# Patient Record
Sex: Male | Born: 1985 | Race: White | Hispanic: No | Marital: Single | Smoking: Never smoker
Health system: Southern US, Community
[De-identification: ages and names within clinical notes are randomized; demographics above are authoritative.]

---

## 2017-06-20 ENCOUNTER — Inpatient Hospital Stay
Admission: EM | Admit: 2017-06-20 | Discharge: 2017-06-29 | DRG: 871 | Disposition: A | Discharge status: Home / Self Care | Payer: BLUE CROSS/BLUE SHIELD | Attending: Internal Medicine | Admitting: Internal Medicine

## 2017-06-20 ENCOUNTER — Emergency Department: Payer: BLUE CROSS/BLUE SHIELD

## 2017-06-20 ENCOUNTER — Encounter: Payer: Self-pay | Admitting: Emergency Medicine

## 2017-06-20 ENCOUNTER — Other Ambulatory Visit: Payer: Self-pay

## 2017-06-20 DIAGNOSIS — J849 Interstitial pulmonary disease, unspecified: Secondary | ICD-10-CM | POA: Diagnosis present

## 2017-06-20 DIAGNOSIS — J189 Pneumonia, unspecified organism: Secondary | ICD-10-CM

## 2017-06-20 DIAGNOSIS — R0689 Other abnormalities of breathing: Secondary | ICD-10-CM

## 2017-06-20 DIAGNOSIS — J9601 Acute respiratory failure with hypoxia: Secondary | ICD-10-CM | POA: Diagnosis present

## 2017-06-20 DIAGNOSIS — J181 Lobar pneumonia, unspecified organism: Secondary | ICD-10-CM | POA: Diagnosis present

## 2017-06-20 DIAGNOSIS — N179 Acute kidney failure, unspecified: Secondary | ICD-10-CM | POA: Diagnosis present

## 2017-06-20 DIAGNOSIS — A419 Sepsis, unspecified organism: Secondary | ICD-10-CM | POA: Diagnosis present

## 2017-06-20 DIAGNOSIS — R0902 Hypoxemia: Secondary | ICD-10-CM

## 2017-06-20 DIAGNOSIS — R030 Elevated blood-pressure reading, without diagnosis of hypertension: Secondary | ICD-10-CM | POA: Diagnosis present

## 2017-06-20 LAB — BASIC METABOLIC PANEL
ANION GAP: 13 (ref 5–15)
BUN: 13 mg/dL (ref 6–20)
CALCIUM: 8.7 mg/dL — AB (ref 8.9–10.3)
CO2: 26 mmol/L (ref 22–32)
CREATININE: 1.35 mg/dL — AB (ref 0.61–1.24)
Chloride: 96 mmol/L — ABNORMAL LOW (ref 101–111)
Glucose, Bld: 133 mg/dL — ABNORMAL HIGH (ref 65–99)
Potassium: 3.8 mmol/L (ref 3.5–5.1)
SODIUM: 135 mmol/L (ref 135–145)

## 2017-06-20 LAB — CBC WITH DIFFERENTIAL/PLATELET
BASOS ABS: 0 10*3/uL (ref 0–0.1)
BASOS PCT: 0 %
EOS ABS: 0.1 10*3/uL (ref 0–0.7)
Eosinophils Relative: 1 %
HCT: 38.8 % — ABNORMAL LOW (ref 40.0–52.0)
Hemoglobin: 13.4 g/dL (ref 13.0–18.0)
Lymphocytes Relative: 7 %
Lymphs Abs: 0.7 10*3/uL — ABNORMAL LOW (ref 1.0–3.6)
MCH: 29.8 pg (ref 26.0–34.0)
MCHC: 34.5 g/dL (ref 32.0–36.0)
MCV: 86.4 fL (ref 80.0–100.0)
MONO ABS: 0.7 10*3/uL (ref 0.2–1.0)
MONOS PCT: 7 %
Neutro Abs: 8.9 10*3/uL — ABNORMAL HIGH (ref 1.4–6.5)
Neutrophils Relative %: 85 %
PLATELETS: 344 10*3/uL (ref 150–440)
RBC: 4.49 MIL/uL (ref 4.40–5.90)
RDW: 13.1 % (ref 11.5–14.5)
WBC: 10.4 10*3/uL (ref 3.8–10.6)

## 2017-06-20 LAB — INFLUENZA PANEL BY PCR (TYPE A & B)
INFLBPCR: NEGATIVE
Influenza A By PCR: NEGATIVE

## 2017-06-20 LAB — MRSA PCR SCREENING: MRSA by PCR: NEGATIVE

## 2017-06-20 LAB — BRAIN NATRIURETIC PEPTIDE: B Natriuretic Peptide: 41 pg/mL (ref 0.0–100.0)

## 2017-06-20 LAB — EXPECTORATED SPUTUM ASSESSMENT W GRAM STAIN, RFLX TO RESP C: Special Requests: NORMAL

## 2017-06-20 LAB — EXPECTORATED SPUTUM ASSESSMENT W REFEX TO RESP CULTURE

## 2017-06-20 LAB — TROPONIN I

## 2017-06-20 LAB — LACTIC ACID, PLASMA
Lactic Acid, Venous: 1.7 mmol/L (ref 0.5–1.9)
Lactic Acid, Venous: 1.9 mmol/L (ref 0.5–1.9)

## 2017-06-20 MED ORDER — SODIUM CHLORIDE 0.9 % IV SOLN
INTRAVENOUS | Status: DC
  Administered 2017-06-20 – 2017-06-25 (×11): via INTRAVENOUS

## 2017-06-20 MED ORDER — SODIUM CHLORIDE 0.9 % IV BOLUS (SEPSIS)
1000.0000 mL | Freq: Once | INTRAVENOUS | Status: AC
  Administered 2017-06-20: 1000 mL via INTRAVENOUS

## 2017-06-20 MED ORDER — OSELTAMIVIR PHOSPHATE 75 MG PO CAPS
75.0000 mg | ORAL_CAPSULE | Freq: Once | ORAL | Status: AC
  Administered 2017-06-20: 75 mg via ORAL
  Filled 2017-06-20: qty 1

## 2017-06-20 MED ORDER — ACETAMINOPHEN 650 MG RE SUPP: 650.0000 mg | Freq: Four times a day (QID) | RECTAL | Status: DC | PRN

## 2017-06-20 MED ORDER — ORAL CARE MOUTH RINSE
15.0000 mL | Freq: Two times a day (BID) | OROMUCOSAL | Status: DC
  Administered 2017-06-20 – 2017-06-28 (×17): 15 mL via OROMUCOSAL

## 2017-06-20 MED ORDER — IPRATROPIUM-ALBUTEROL 0.5-2.5 (3) MG/3ML IN SOLN
3.0000 mL | Freq: Once | RESPIRATORY_TRACT | Status: AC
  Administered 2017-06-20: 3 mL via RESPIRATORY_TRACT
  Filled 2017-06-20: qty 3

## 2017-06-20 MED ORDER — VANCOMYCIN HCL IN DEXTROSE 1-5 GM/200ML-% IV SOLN
1000.0000 mg | Freq: Once | INTRAVENOUS | Status: AC
  Administered 2017-06-20: 1000 mg via INTRAVENOUS
  Filled 2017-06-20: qty 200

## 2017-06-20 MED ORDER — PIPERACILLIN-TAZOBACTAM 3.375 G IVPB 30 MIN
3.3750 g | Freq: Once | INTRAVENOUS | Status: AC
  Administered 2017-06-20: 3.375 g via INTRAVENOUS
  Filled 2017-06-20: qty 50

## 2017-06-20 MED ORDER — ONDANSETRON HCL 4 MG/2ML IJ SOLN: 4.0000 mg | Freq: Four times a day (QID) | INTRAMUSCULAR | Status: DC | PRN

## 2017-06-20 MED ORDER — ENOXAPARIN SODIUM 40 MG/0.4ML ~~LOC~~ SOLN
40.0000 mg | SUBCUTANEOUS | Status: DC
  Administered 2017-06-20 – 2017-06-28 (×9): 40 mg via SUBCUTANEOUS
  Filled 2017-06-20 (×9): qty 0.4

## 2017-06-20 MED ORDER — IPRATROPIUM-ALBUTEROL 0.5-2.5 (3) MG/3ML IN SOLN
3.0000 mL | Freq: Four times a day (QID) | RESPIRATORY_TRACT | Status: DC
  Administered 2017-06-20 – 2017-06-26 (×26): 3 mL via RESPIRATORY_TRACT
  Filled 2017-06-20 (×24): qty 3

## 2017-06-20 MED ORDER — ACETAMINOPHEN 325 MG PO TABS
650.0000 mg | ORAL_TABLET | Freq: Four times a day (QID) | ORAL | Status: DC | PRN
  Administered 2017-06-21: 18:00:00 650 mg via ORAL
  Filled 2017-06-20: qty 2

## 2017-06-20 MED ORDER — VANCOMYCIN HCL IN DEXTROSE 1-5 GM/200ML-% IV SOLN: 1000.0000 mg | Freq: Three times a day (TID) | INTRAVENOUS | Status: DC

## 2017-06-20 MED ORDER — PIPERACILLIN-TAZOBACTAM 3.375 G IVPB
3.3750 g | Freq: Three times a day (TID) | INTRAVENOUS | Status: DC
  Administered 2017-06-20 – 2017-06-25 (×14): 3.375 g via INTRAVENOUS
  Filled 2017-06-20 (×14): qty 50

## 2017-06-20 MED ORDER — ONDANSETRON HCL 4 MG PO TABS: 4.0000 mg | ORAL_TABLET | Freq: Four times a day (QID) | ORAL | Status: DC | PRN

## 2017-06-20 NOTE — ED Provider Notes (Signed)
[facility] Emergency Department Provider Note       Time seen: ----------------------------------------- 10:34 AM on 06/20/2017 -----------------------------------------   I have reviewed the triage vital signs and the nursing notes.  HISTORY   Chief Complaint No chief complaint on file.    HPI Edgar Michael is a 32 y.o. male with no significant past medical history who presents to the ED for dyspnea and persistent cough.  Patient was treated last week for influenza.  He took cough preparation as well as Tamiflu and has had worsening of symptoms.  He is having productive green sputum.  He has shortness of breath and was seen at an urgent care and referred here for evaluation due to hypoxia.  No past medical history on file.  There are no active problems to display for this patient.   Allergies Patient has no allergy information on record.  Social History Social History   Tobacco Use  . Smoking status: Not on file  Substance Use Topics  . Alcohol use: Not on file  . Drug use: Not on file   Review of Systems Constitutional: Negative for fever. Cardiovascular: Negative for chest pain. Respiratory: Positive for shortness of breath with productive cough Gastrointestinal: Negative for abdominal pain, vomiting and diarrhea. Musculoskeletal: Negative for back pain. Skin: Negative for rash. Neurological: Negative for headaches, focal weakness or numbness.  All systems negative/normal/unremarkable except as stated in the HPI  ____________________________________________   PHYSICAL EXAM:  VITAL SIGNS: ED Triage Vitals  Enc Vitals Group     BP      Pulse      Resp      Temp      Temp src      SpO2      Weight      Height      Head Circumference      Peak Flow      Pain Score      Pain Loc      Pain Edu?      Excl. in GC?     Constitutional: Alert and oriented.  Anxious, mild distress Eyes: Conjunctivae are normal. Normal  extraocular movements. ENT   Head: Normocephalic and atraumatic.   Nose: No congestion/rhinnorhea.   Mouth/Throat: Mucous membranes are moist.   Neck: No stridor. Cardiovascular: Normal rate, regular rhythm. No murmurs, rubs, or gallops. Respiratory: Mild tachypnea, rales particular in the right base Gastrointestinal: Soft and nontender. Normal bowel sounds Musculoskeletal: Nontender with normal range of motion in extremities. No lower extremity tenderness nor edema. Neurologic:  Normal speech and language. No gross focal neurologic deficits are appreciated.  Skin:  Skin is warm, dry and intact.  Mild diaphoresis is noted Psychiatric: Mood and affect are normal. Speech and behavior are normal.  ____________________________________________  ED COURSE:  As part of my medical decision making, I reviewed the following data within the electronic MEDICAL RECORD NUMBER History obtained from family if available, nursing notes, old chart and ekg, as well as notes from prior ED visits. Patient presented for hypoxia, we will assess with labs and imaging as indicated at this time.   Procedures ____________________________________________   LABS (pertinent positives/negatives)  Labs Reviewed  CBC WITH DIFFERENTIAL/PLATELET - Abnormal; Notable for the following components:      Result Value   HCT 38.8 (*)    Neutro Abs 8.9 (*)    Lymphs Abs 0.7 (*)    All other components within normal limits  BASIC METABOLIC PANEL - Abnormal; Notable for  the following components:   Chloride 96 (*)    Glucose, Bld 133 (*)    Creatinine, Ser 1.35 (*)    Calcium 8.7 (*)    All other components within normal limits  CULTURE, BLOOD (ROUTINE X 2)  CULTURE, BLOOD (ROUTINE X 2)  CULTURE, EXPECTORATED SPUTUM-ASSESSMENT  BRAIN NATRIURETIC PEPTIDE  TROPONIN I  LACTIC ACID, PLASMA  LACTIC ACID, PLASMA  INFLUENZA PANEL BY PCR (TYPE A & B)   CRITICAL CARE Performed by: Ulice DashJohnathan E Williams   Total  critical care time: 30 minutes  Critical care time was exclusive of separately billable procedures and treating other patients.  Critical care was necessary to treat or prevent imminent or life-threatening deterioration.  Critical care was time spent personally by me on the following activities: development of treatment plan with patient and/or surrogate as well as nursing, discussions with consultants, evaluation of patient's response to treatment, examination of patient, obtaining history from patient or surrogate, ordering and performing treatments and interventions, ordering and review of laboratory studies, ordering and review of radiographic studies, pulse oximetry and re-evaluation of patient's condition.  RADIOLOGY Images were viewed by me  CXR  IMPRESSION: Right middle lobe consolidation with patchy lower lobe airspace opacities bilaterally consistent with multilobar pneumonia.  These results will be called to the ordering clinician or representative by the Radiologist Assistant, and communication documented in the PACS or zVision Dashboard. ____________________________________________  DIFFERENTIAL DIAGNOSIS   Pneumonia, influenza, pneumothorax, PE, bronchitis, URI  FINAL ASSESSMENT AND PLAN  Pneumonia, hypoxia   Plan: Patient had presented for shortness of breath and possible recent treatment for influenza. Patient's labs were remarkably reassuring despite his significant hypoxia. Patient's imaging revealed a right middle lobe and bilateral lower lobe airspace opacities consistent with multi lobar pneumonia.  We have started on broad-spectrum antibiotics including vancomycin and Zosyn.  I also began treating with Tamiflu.  He remains hypoxic and desaturates quickly off of oxygen.  I will discussed with the hospitalist for admission.   Ulice DashJohnathan E Williams, MD   Note: This note was generated in part or whole with voice recognition software. Voice recognition is  usually quite accurate but there are transcription errors that can and very often do occur. I apologize for any typographical errors that were not detected and corrected.     Emily FilbertWilliams, Jonathan E, MD 06/20/17 1149

## 2017-06-20 NOTE — ED Triage Notes (Signed)
Pt to ED via EMS from urgent care for sob, pt was recently diagnosed with flu last week, c/o increased chills and sob. Pt is diaphoretic and SOB, 85% on RA

## 2017-06-20 NOTE — Plan of Care (Signed)
  Progressing Education: Knowledge of General Education information will improve 06/20/2017 2316 - Progressing by Carolin SicksBollinger, Zailey Audia R, RN Health Behavior/Discharge Planning: Ability to manage health-related needs will improve 06/20/2017 2317 - Progressing by Carolin SicksBollinger, Dietz Mulvey R, RN 06/20/2017 2316 - Progressing by Carolin SicksBollinger, Dashiel Bergquist R, RN Clinical Measurements: Ability to maintain clinical measurements within normal limits will improve 06/20/2017 2316 - Progressing by Carolin SicksBollinger, Coe Angelos R, RN Will remain free from infection 06/20/2017 2316 - Progressing by Carolin SicksBollinger, Navya Timmons R, RN Diagnostic test results will improve 06/20/2017 2316 - Progressing by Carolin SicksBollinger, Shayma Pfefferle R, RN Respiratory complications will improve 06/20/2017 2316 - Progressing by Carolin SicksBollinger, Geneva Pallas R, RN Cardiovascular complication will be avoided 06/20/2017 2316 - Progressing by Carolin SicksBollinger, Naol Ontiveros R, RN Activity: Risk for activity intolerance will decrease 06/20/2017 2316 - Progressing by Carolin SicksBollinger, Jasha Hodzic R, RN Nutrition: Adequate nutrition will be maintained 06/20/2017 2317 - Progressing by Carolin SicksBollinger, Aarush Stukey R, RN 06/20/2017 2316 - Progressing by Carolin SicksBollinger, Ravonda Brecheen R, RN Coping: Level of anxiety will decrease 06/20/2017 2316 - Progressing by Carolin SicksBollinger, Elvira Langston R, RN Elimination: Will not experience complications related to bowel motility 06/20/2017 2317 - Progressing by Carolin SicksBollinger, Derreon Consalvo R, RN 06/20/2017 2316 - Progressing by Carolin SicksBollinger, Feven Alderfer R, RN Will not experience complications related to urinary retention 06/20/2017 2317 - Progressing by Carolin SicksBollinger, Jacquelinne Speak R, RN 06/20/2017 2316 - Progressing by Carolin SicksBollinger, Yancey Pedley R, RN Pain Managment: General experience of comfort will improve 06/20/2017 2316 - Progressing by Carolin SicksBollinger, Vertis Scheib R, RN Safety: Ability to remain free from injury will improve 06/20/2017 2316 - Progressing by Carolin SicksBollinger, Marveen Donlon R, RN Skin Integrity: Risk for impaired skin integrity will decrease 06/20/2017 2317 - Progressing by  Carolin SicksBollinger, Bharat Antillon R, RN 06/20/2017 2316 - Progressing by Carolin SicksBollinger, Pearla Mckinny R, RN

## 2017-06-20 NOTE — ED Notes (Signed)
Report to Chris.

## 2017-06-20 NOTE — Progress Notes (Signed)
SLP Cancellation Note  Patient Details Name: Edgar Michael MRN: [id number] DOB: Oct 31, 1985   Cancelled treatment:       Reason Eval/Treat Not Completed: SLP screened, no needs identified, will sign off(chart reviewed; consulted pt and Mother in room). Pt denied any difficulty swallowing and is currently on a regular diet; tolerates swallowing pills w/ water per NSG in the ED per his report. Pt denied any difficulty swallowing at home w/ meals/liquids. He did state he had been sick (flu?) w/ drainage and does sleep on his R side in bed at night. Pt conversive and A/Ox3.  Pt and Mother denied any need for a swallowing eval at this time. No further skilled ST services indicated as pt appears at his baseline. Recommend a Regular diet consistency w/ general aspiration precautions. Pt agreed. NSG to reconsult if any change in status.    Jerilynn SomKatherine Timo Hartwig, MS, CCC-SLP Edgar Michael 06/20/2017, 2:09 PM

## 2017-06-20 NOTE — H&P (Signed)
[facility] -  at [facility]   PATIENT NAME: Edgar Michael    MR#:  [id number]  DATE OF BIRTH:  1986-04-02  DATE OF ADMISSION:  06/20/2017  PRIMARY CARE PHYSICIAN: Patient, No Pcp Per   REQUESTING/REFERRING PHYSICIAN: dr Mayford Knifewilliams  CHIEF COMPLAINT:   SOB HISTORY OF PRESENT ILLNESS:  Edgar Michael  is a 32 y.o. male with no medical history who presents today to the emergency room complaining shortness of breath and cough. Patient came via EMS from urgent care. He was diaphoretic and shortness of breath. His O2 sat was 85% on room air. He is currently on 6 L oxygen. He was seen at urgent care this when his day and tested negative for influenza however did receive Tamiflu. He had influenza testing again today and it was negative. Patient had a chest x-ray which showed dense right middle lobe consolidation. He has been started on Zosyn by ED physician. Patient reports weakness, low-grade fever, body aches and chills. Patient reports no HIV risk factors.  PAST MEDICAL HISTORY:  No past medical history  PAST SURGICAL HISTORY:  None  SOCIAL HISTORY:   Social History   Tobacco Use  . Smoking status: Never Smoker  . Smokeless tobacco: Never Used  Substance Use Topics  . Alcohol use: No    Frequency: Never    FAMILY HISTORY:  No history of CAD  DRUG ALLERGIES:  No Known Allergies  REVIEW OF SYSTEMS:   Review of Systems  Constitutional: Positive for chills, fever and malaise/fatigue.  HENT: Negative.  Negative for ear discharge, ear pain, hearing loss, nosebleeds and sore throat.   Eyes: Negative.  Negative for blurred vision and pain.  Respiratory: Positive for cough, shortness of breath and wheezing. Negative for hemoptysis.   Cardiovascular: Negative.  Negative for chest pain, palpitations and leg swelling.  Gastrointestinal: Negative.  Negative for abdominal pain, blood in stool, diarrhea, nausea and vomiting.  Genitourinary: Negative.  Negative for  dysuria.  Musculoskeletal: Negative.  Negative for back pain.  Skin: Negative.   Neurological: Positive for weakness. Negative for dizziness, tremors, speech change, focal weakness, seizures and headaches.  Endo/Heme/Allergies: Negative.  Does not bruise/bleed easily.  Psychiatric/Behavioral: Negative.  Negative for depression, hallucinations and suicidal ideas.    MEDICATIONS AT HOME:   None   VITAL SIGNS:  Blood pressure (!) 141/73, pulse (!) 111, temperature 98.7 F (37.1 C), temperature source Oral, resp. rate (!) 22, height 6\' 1"  (1.854 m), weight 104.3 kg (230 lb), SpO2 94 %.  PHYSICAL EXAMINATION:   Physical Exam  Constitutional: He is oriented to person, place, and time and well-developed, well-nourished, and in no distress. No distress.  HENT:  Head: Normocephalic.  Eyes: No scleral icterus.  Neck: Normal range of motion. Neck supple. No JVD present. No tracheal deviation present.  Cardiovascular: Normal rate, regular rhythm and normal heart sounds. Exam reveals no gallop and no friction rub.  No murmur heard. Pulmonary/Chest: Effort normal and breath sounds normal. No respiratory distress. He has no wheezes. He has no rales. He exhibits no tenderness.  Bilateral rhonchi right greater than left  Abdominal: Soft. Bowel sounds are normal. He exhibits no distension and no mass. There is no tenderness. There is no rebound and no guarding.  Musculoskeletal: Normal range of motion. He exhibits no edema.  Neurological: He is alert and oriented to person, place, and time.  Skin: Skin is warm. No rash noted. No erythema.  Psychiatric: Affect and judgment normal.  LABORATORY PANEL:   CBC Recent Labs  Lab 06/20/17 1036  WBC 10.4  HGB 13.4  HCT 38.8*  PLT 344   ------------------------------------------------------------------------------------------------------------------  Chemistries  Recent Labs  Lab 06/20/17 1036  NA 135  K 3.8  CL 96*  CO2 26  GLUCOSE  133*  BUN 13  CREATININE 1.35*  CALCIUM 8.7*   ------------------------------------------------------------------------------------------------------------------  Cardiac Enzymes Recent Labs  Lab 06/20/17 1036  TROPONINI <0.03   ------------------------------------------------------------------------------------------------------------------  RADIOLOGY:  Dg Chest 2 View  Result Date: 06/20/2017 CLINICAL DATA:  Flu like symptoms for 1 week. Weakness with fever and bradycardia. EXAM: CHEST  2 VIEW COMPARISON:  None. FINDINGS: The heart size and mediastinal contours are normal. There is dense right middle lobe airspace disease with mild volume loss, consistent with pneumonia. Patchy airspace opacities are present in both lower lobes. There is no significant pleural effusion. The bones appear unremarkable. IMPRESSION: Right middle lobe consolidation with patchy lower lobe airspace opacities bilaterally consistent with multilobar pneumonia. These results will be called to the ordering clinician or representative by the Radiologist Assistant, and communication documented in the PACS or zVision Dashboard. Electronically Signed   By: Carey Bullocks M.D.   On: 06/20/2017 11:36    EKG:  No orders found for this or any previous visit.  IMPRESSION AND PLAN:   32 year old male with no past medical history presents with cough and shortness of breath.  1 Sepsis: Patient presents with tachycardia and tachypnea. Sepsis is due to community-acquired pneumonia  2. Acute hypoxic respiratory failure in the setting of community-acquired pneumonia Continue Zosyn Speech consultation for right middle lobe consolidation Pulmonary consultation Check HIV Follow up on blood cultures  3. Community-acquired pneumonia: Continue plan as stated above  4. Acute kidney injury in the setting of sepsis Start IV fluids and repeat BMP in a.m.  5. Elevated blood pressure without diagnosis of hypertension Follow  blood pressure Likely due to respiratory distress   All the records are reviewed and case discussed with ED provider. Management plans discussed with the patient and family and he is in agreement  CODE STATUS: FULL  TOTAL TIME TAKING CARE OF THIS PATIENT: 45 minutes.    Mackenna Kamer M.D on 06/20/2017 at 12:29 PM  Between 7am to 6pm - Pager - [phone]  After 6pm go to [email or web address] - password [email or web address]  [facility]  Office  [phone]  CC: Primary care physician; Patient, No Pcp Per

## 2017-06-20 NOTE — Progress Notes (Addendum)
ANTIBIOTIC CONSULT NOTE - INITIAL  Pharmacy Consult for Zosyn and vancomycin Indication: CAP/PNA  No Known Allergies  Patient Measurements: Height: 6\' 1"  (185.4 cm) Weight: 230 lb (104.3 kg) IBW/kg (Calculated) : 79.9 Adjusted Body Weight:   Vital Signs: Temp: 98.7 F (37.1 C) (02/18 1040) Temp Source: Oral (02/18 1040) BP: 141/73 (02/18 1142) Pulse Rate: 111 (02/18 1142) Intake/Output from previous day: No intake/output data recorded. Intake/Output from this shift: No intake/output data recorded.  Labs: Recent Labs    06/20/17 1036  WBC 10.4  HGB 13.4  PLT 344  CREATININE 1.35*   Estimated Creatinine Clearance: 102.4 mL/min (A) (by C-G formula based on SCr of 1.35 mg/dL (H)). No results for input(s): VANCOTROUGH, VANCOPEAK, VANCORANDOM, GENTTROUGH, GENTPEAK, GENTRANDOM, TOBRATROUGH, TOBRAPEAK, TOBRARND, AMIKACINPEAK, AMIKACINTROU, AMIKACIN in the last 72 hours.   Microbiology: No results found for this or any previous visit (from the past 720 hour(s)).  Medical History: History reviewed. No pertinent past medical history.  Medications:  Infusions:  . sodium chloride    . piperacillin-tazobactam 3.375 g (06/20/17 1151)  . piperacillin-tazobactam (ZOSYN)  IV    . sodium chloride 1,000 mL (06/20/17 1151)  . sodium chloride 1,000 mL (06/20/17 1150)  . vancomycin 1,000 mg (06/20/17 1151)  . vancomycin     Assessment: 31 yom cc dyspnea/persistent cough. Treated last week for influenza with Tamiflu and supportive. Returned to urgent care d/t persistent SOB and referred to ED. In ED CXR with RML consolidation/patchy lower lobe airspace opacities b/l consistent with multilobar PNA. MRSA PCR negative. Pharmacy consulted to dose vancomycin and Zosyn for PNA.  Goal of Therapy:  Vancomycin trough level 15-20 mcg/ml  Plan:  1. Zosyn 3.375 gm IV Q8H EI 2. Vancomycin 1 gm IV x 1 in ED followed in approximately 6 hours (Stacked dosing) by vancomycin 1 gm IV Q8H, predicted  trough 16 mcg/mL. Pharmacy will continue to follow and adjust as needed to maintain trough 15 to 20 mcg/mL.   Vd 62.8 L, Ke 0.089 hr-1, T1/2 7.8 hr  06/20/17 12:19 - MRSA PCR negative - Dr. Luberta MutterKonidena gives verbal to d/c vancomycin.   Carola FrostNathan A Kristian Hazzard, Pharm.D., BCPS Clinical Pharmacist 06/20/2017,12:13 PM

## 2017-06-21 LAB — BASIC METABOLIC PANEL
ANION GAP: 14 (ref 5–15)
BUN: 12 mg/dL (ref 6–20)
CHLORIDE: 106 mmol/L (ref 101–111)
CO2: 21 mmol/L — AB (ref 22–32)
Calcium: 8.7 mg/dL — ABNORMAL LOW (ref 8.9–10.3)
Creatinine, Ser: 1.08 mg/dL (ref 0.61–1.24)
GFR calc non Af Amer: >60 mL/min (ref >60)
GLUCOSE: 121 mg/dL — AB (ref 65–99)
Potassium: 4.3 mmol/L (ref 3.5–5.1)
Sodium: 141 mmol/L (ref 135–145)

## 2017-06-21 LAB — CBC
HCT: 35.8 % — ABNORMAL LOW (ref 40.0–52.0)
HEMOGLOBIN: 12.3 g/dL — AB (ref 13.0–18.0)
MCH: 29.8 pg (ref 26.0–34.0)
MCHC: 34.3 g/dL (ref 32.0–36.0)
MCV: 86.8 fL (ref 80.0–100.0)
Platelets: 352 10*3/uL (ref 150–440)
RBC: 4.12 MIL/uL — AB (ref 4.40–5.90)
RDW: 12.5 % (ref 11.5–14.5)
WBC: 9.4 10*3/uL (ref 3.8–10.6)

## 2017-06-21 LAB — GLUCOSE, CAPILLARY: Glucose-Capillary: 103 mg/dL — ABNORMAL HIGH (ref 65–99)

## 2017-06-21 LAB — HIV ANTIBODY (ROUTINE TESTING W REFLEX): HIV Screen 4th Generation wRfx: NONREACTIVE

## 2017-06-21 NOTE — Progress Notes (Signed)
[facility] - [place] at [facility]   PATIENT NAME: Edgar Michael    MR#:  [id number]  DATE OF BIRTH:  [birth year]  SUBJECTIVE:   Patient was on nonrebreather last night. He felt that he slept better with a nonrebreather. Continues to have shortness of breath and cough  REVIEW OF SYSTEMS:    Review of Systems  Constitutional: Negative for fever, chills weight loss HENT: Negative for ear pain, nosebleeds, congestion, facial swelling, rhinorrhea, neck pain, neck stiffness and ear discharge.   Respiratory: Positive for cough and shortness of breath  Cardiovascular: Negative for chest pain, palpitations and leg swelling.  Gastrointestinal: Negative for heartburn, abdominal pain, vomiting, diarrhea or consitpation Genitourinary: Negative for dysuria, urgency, frequency, hematuria Musculoskeletal: Negative for back pain or joint pain Neurological: Negative for dizziness, seizures, syncope, focal weakness,  numbness and headaches.  Hematological: Does not bruise/bleed easily.  Psychiatric/Behavioral: Negative for hallucinations, confusion, dysphoric mood    Tolerating Diet: yes      DRUG ALLERGIES:  No Known Allergies  VITALS:  Blood pressure 140/77, pulse 92, temperature 99.8 F (37.7 C), temperature source Oral, resp. rate 17, height 6\' 1"  (1.854 m), weight 90.4 kg (199 lb 6.4 oz), SpO2 92 %.  PHYSICAL EXAMINATION:  Constitutional: Appears well-developed and well-nourished. No distress. HENT: Normocephalic. Marland Kitchen Oropharynx is clear and moist.  Eyes: Conjunctivae and EOM are normal. PERRLA, no scleral icterus.  Neck: Normal ROM. Neck supple. No JVD. No tracheal deviation. CVS: RRR, S1/S2 +, no murmurs, no gallops, no carotid bruit.  Pulmonary: Normal respiratory effort with expiratory crackles right middle lobe Abdominal: Soft. BS +,  no distension, tenderness, rebound or guarding.  Musculoskeletal: Normal range of motion. No edema and no tenderness.  Neuro:  Alert. CN 2-12 grossly intact. No focal deficits. Skin: Skin is warm and dry. No rash noted. Psychiatric: Normal mood and affect.      LABORATORY PANEL:   CBC Recent Labs  Lab 06/21/17 0459  WBC 9.4  HGB 12.3*  HCT 35.8*  PLT 352   ------------------------------------------------------------------------------------------------------------------  Chemistries  Recent Labs  Lab 06/21/17 0459  NA 141  K 4.3  CL 106  CO2 21*  GLUCOSE 121*  BUN 12  CREATININE 1.08  CALCIUM 8.7*   ------------------------------------------------------------------------------------------------------------------  Cardiac Enzymes Recent Labs  Lab 06/20/17 1036  TROPONINI <0.03   ------------------------------------------------------------------------------------------------------------------  RADIOLOGY:  Dg Chest 2 View  Result Date: 06/20/2017 CLINICAL DATA:  Flu like symptoms for 1 week. Weakness with fever and bradycardia. EXAM: CHEST  2 VIEW COMPARISON:  None. FINDINGS: The heart size and mediastinal contours are normal. There is dense right middle lobe airspace disease with mild volume loss, consistent with pneumonia. Patchy airspace opacities are present in both lower lobes. There is no significant pleural effusion. The bones appear unremarkable. IMPRESSION: Right middle lobe consolidation with patchy lower lobe airspace opacities bilaterally consistent with multilobar pneumonia. These results will be called to the ordering clinician or representative by the Radiologist Assistant, and communication documented in the PACS or zVision Dashboard. Electronically Signed   By: Carey Bullocks M.D.   On: 06/20/2017 11:36     ASSESSMENT AND PLAN:   32 year old male with no past medical history presents with cough and shortness of breath.  1 Sepsis: Patient presents with tachycardia and tachypnea. Sepsis is due to community-acquired pneumonia  2. Acute hypoxic respiratory failure in the  setting of community-acquired pneumonia Continue Zosyn Blood cultures are negative. Sputum culture shows few gram-positive cocci and peers and few  gram-positive rods wean to room air is tolerated.   3. Community-acquired pneumonia: Continue plan as stated above  4. Acute kidney injury in the setting of sepsis Creatinine has improved with IV fluids  5. Elevated blood pressure without diagnosis of hypertension Follow blood pressure Likely due to respiratory distress        Management plans discussed with the patient and he is in agreement.  CODE STATUS: Full  TOTAL TIME TAKING CARE OF THIS PATIENT: 30 minutes.     POSSIBLE D/C 2 days, DEPENDING ON CLINICAL CONDITION.   Edgar Michael M.D on 06/21/2017 at 11:05 AM  Between 7am to 6pm - Pager - [phone] After 6pm go to [email or web address] - password [email or web address]  [facility]  Office  [phone]  CC: Primary care physician; Patient, No Pcp Per  Note: This dictation was prepared with Dragon dictation along with smaller phrase technology. Any transcriptional errors that result from this process are unintentional.

## 2017-06-22 LAB — GLUCOSE, CAPILLARY: GLUCOSE-CAPILLARY: 93 mg/dL (ref 65–99)

## 2017-06-22 MED ORDER — GUAIFENESIN-DM 100-10 MG/5ML PO SYRP
5.0000 mL | ORAL_SOLUTION | ORAL | Status: DC | PRN
  Administered 2017-06-23 – 2017-06-28 (×4): 5 mL via ORAL
  Filled 2017-06-22 (×5): qty 5

## 2017-06-22 MED ORDER — SODIUM CHLORIDE 0.9 % IV SOLN
500.0000 mg | INTRAVENOUS | Status: DC
  Administered 2017-06-22 – 2017-06-24 (×3): 500 mg via INTRAVENOUS
  Filled 2017-06-22 (×4): qty 500

## 2017-06-22 NOTE — Progress Notes (Signed)
Date: 06/22/2017,   MRN# [id number] Edgar Michael 06-Jan-1986 Code Status:     Code Status Orders  (From admission, onward)        Start     Ordered   06/20/17 1155  Full code  Continuous     06/20/17 1155    Code Status History    Date Active Date Inactive Code Status Order ID Comments User Context   This patient has a current code status but no historical code status.     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 Initially seen in the out patient for cough and viral like sxs. Flu screen was - ve. On w/u noted to have bilateral interstitial pneumonia and RML consolidation. No hx of aspiration or choking spells. On zosyn. Quite dyspneic. Need high fio2. High flow started.  No recent travel.     PMHX:   History reviewed. No pertinent past medical history. Surgical Hx:  History reviewed. No pertinent surgical history. Family Hx:  No family history on file. Social Hx:   Social History   Tobacco Use  . Smoking status: Never Smoker  . Smokeless tobacco: Never Used  Substance Use Topics  . Alcohol use: No    Frequency: Never  . Drug use: No   Medication:    Home Medication:    Current Medication: @CURMEDTAB @   Allergies:  Patient has no known allergies.  Review of Systems: Gen:  Denies  fever, sweats, chills HEENT: Denies blurred vision, double vision, ear pain, eye pain, hearing loss, nose bleeds, sore throat Cvc:  No dizziness, chest pain or heaviness Resp:    Gi: Denies swallowing difficulty, stomach pain, nausea or vomiting, diarrhea, constipation, bowel incontinence Gu:  Denies bladder incontinence, burning urine Ext:   No Joint pain, stiffness or swelling Skin: No skin rash, easy bruising or bleeding or hives Endoc:  No polyuria, polydipsia , polyphagia or weight change Psych: No depression, insomnia or hallucinations  Other:  All other systems  negative  Physical Examination:   VS: BP 125/73 (BP Location: Left Arm)   Pulse (!) 103   Temp 98.3 F (36.8 C) (Oral)   Resp 18   Ht 6\' 1"  (1.854 m)   Wt 90.5 kg (199 lb 8.3 oz)   SpO2 94%   BMI 26.32 kg/m   General Appearance: moderate distress, on high flow, spoke with respiratory, well built  Neuro: without focal findings, mental status, speech normal, alert and oriented, cranial nerves 2-12 intact, reflexes normal and symmetric, sensation grossly normal  HEENT: PERRLA, EOM intact, no ptosis, no other lesions noticed NECK: Supple, no stridor Pulmonary:.No wheezing, No rales somewhat coarse breat sounds Cardiovascular:  Normal S1,S2.  No m/r/g.  .    Abdomen:Benign, Soft, non-tender, No masses hepatosplenomegaly, No lymphadenopathy Endoc: No evident thyromegaly, no signs of acromegaly or Cushing features Skin:   warm, no rashes, no ecchymosis  Extremities: normal, no cyanosis, clubbing, no edema, warm with normal capillary refill.    Labs results:   Recent Labs    06/20/17 1036 06/21/17 0459  HGB 13.4 12.3*  HCT 38.8* 35.8*  MCV 86.4 86.8  WBC 10.4 9.4  BUN 13 12  CREATININE 1.35* 1.08  GLUCOSE 133* 121*  CALCIUM 8.7* 8.7*  ,    Rad results:  CLINICAL DATA:  Flu like symptoms for 1 week. Weakness with fever and bradycardia.  EXAM: CHEST  2 VIEW  COMPARISON:  None.  FINDINGS: The heart size and mediastinal contours are normal. There is dense right middle lobe airspace disease with mild volume loss, consistent with pneumonia. Patchy airspace opacities are present in both lower lobes. There is no significant pleural effusion. The bones appear unremarkable.  IMPRESSION: Right middle lobe consolidation with patchy lower lobe airspace opacities bilaterally consistent with multilobar pneumonia.  These results will be called to the ordering clinician or representative by the Radiologist Assistant, and communication documented in the PACS or zVision  Dashboard.   Electronically Signed   By: Carey BullocksWilliam  Veazey M.D.   On: 06/20/2017 11:36  No old cxr for comparision  HIV Screen 4th Generation wRfx Non Reactive Non Reactive   Comment: (NOTE)    MRSA by PCR NEGATIVE NEGATIVE    Influenza A By PCR NEGATIVE NEGATIVE   Influenza B By PCR NEGATIVE NEGATIVE      Assessment and Plan: Here with bilateral interstitial pneumonia and RML consolidation. Treating community pneumonia and atypical pn for now. No strong hx of aspiration.   Zosyn, zithro Sputum c/s Wean oxygen as tolerated ( on high flow)  Legionella urine antigen HIV ( done) Following closely     I have personally obtained a history, examined the patient, evaluated laboratory and imaging results, formulated the assessment and plan and placed orders.  The Patient requires high complexity decision making for assessment and support, frequent evaluation and titration of therapies, application of advanced monitoring technologies and extensive interpretation of multiple databases.   Adolf Ormiston,M.D. Pulmonary & Critical care Medicine [facility]

## 2017-06-22 NOTE — Progress Notes (Signed)
[employer] [facility] - [place] at [facility]   PATIENT NAME: Edgar Michael    MR#:  [id number]  DATE OF BIRTH:  1985-05-13  SUBJECTIVE:   Patient has increased shortness of breath and diaphoresis with any movement. He was on nonrebreather last night as this helps him. REVIEW OF SYSTEMS:    Review of Systems  Constitutional: Negative for fever, chills weight loss HENT: Negative for ear pain, nosebleeds, congestion, facial swelling, rhinorrhea, neck pain, neck stiffness and ear discharge.   Respiratory: Positive for cough and shortness of breath  Cardiovascular: Negative for chest pain, palpitations and leg swelling.  Gastrointestinal: Negative for heartburn, abdominal pain, vomiting, diarrhea or consitpation Genitourinary: Negative for dysuria, urgency, frequency, hematuria Musculoskeletal: Negative for back pain or joint pain Neurological: Negative for dizziness, seizures, syncope, focal weakness,  numbness and headaches.  Hematological: Does not bruise/bleed easily.  Psychiatric/Behavioral: Negative for hallucinations, confusion, dysphoric mood    Tolerating Diet: yes      DRUG ALLERGIES:  No Known Allergies  VITALS:  Blood pressure 125/73, pulse (!) 103, temperature 98.3 F (36.8 C), temperature source Oral, resp. rate 18, height 6\' 1"  (1.854 m), weight 90.5 kg (199 lb 8.3 oz), SpO2 94 %.  PHYSICAL EXAMINATION:  Constitutional: Appears well-developed and well-nourished. No distress. HENT: Normocephalic. Marland Kitchen Oropharynx is clear and moist.  Eyes: Conjunctivae and EOM are normal. PERRLA, no scleral icterus.  Neck: Normal ROM. Neck supple. No JVD. No tracheal deviation. CVS: RRR, S1/S2 +, no murmurs, no gallops, no carotid bruit.  Pulmonary: Normal respiratory effort with expiratory crackles right middle lobe Abdominal: Soft. BS +,  no distension, tenderness, rebound or guarding.  Musculoskeletal: Normal range of motion. No edema and no tenderness.  Neuro: Alert. CN  2-12 grossly intact. No focal deficits. Skin: Skin is warm and dry. No rash noted. Psychiatric: Normal mood and affect.      LABORATORY PANEL:   CBC Recent Labs  Lab 06/21/17 0459  WBC 9.4  HGB 12.3*  HCT 35.8*  PLT 352   ------------------------------------------------------------------------------------------------------------------  Chemistries  Recent Labs  Lab 06/21/17 0459  NA 141  K 4.3  CL 106  CO2 21*  GLUCOSE 121*  BUN 12  CREATININE 1.08  CALCIUM 8.7*   ------------------------------------------------------------------------------------------------------------------  Cardiac Enzymes Recent Labs  Lab 06/20/17 1036  TROPONINI <0.03   ------------------------------------------------------------------------------------------------------------------  RADIOLOGY:  Dg Chest 2 View  Result Date: 06/20/2017 CLINICAL DATA:  Flu like symptoms for 1 week. Weakness with fever and bradycardia. EXAM: CHEST  2 VIEW COMPARISON:  None. FINDINGS: The heart size and mediastinal contours are normal. There is dense right middle lobe airspace disease with mild volume loss, consistent with pneumonia. Patchy airspace opacities are present in both lower lobes. There is no significant pleural effusion. The bones appear unremarkable. IMPRESSION: Right middle lobe consolidation with patchy lower lobe airspace opacities bilaterally consistent with multilobar pneumonia. These results will be called to the ordering clinician or representative by the Radiologist Assistant, and communication documented in the PACS or zVision Dashboard. Electronically Signed   By: Carey Bullocks M.D.   On: 06/20/2017 11:36     ASSESSMENT AND PLAN:   32 year old male with no past medical history presents with cough and shortness of breath.  1 Sepsis: Patient presents with tachycardia and tachypnea. Sepsis is due to community-acquired pneumonia  2. Acute hypoxic respiratory failure in the setting of  community-acquired pneumonia Continue Zosyn Blood cultures are negative. Sputum culture shows few gram-positive cocci and peers and few gram-positive rods  Patient seems to respond to nonrebreather. Pulmonary consultation pending  3. Community-acquired pneumonia: Continue plan as stated above  4. Acute kidney injury in the setting of sepsis Creatinine has improved with IV fluids  5. Elevated blood pressure without diagnosis of hypertension Follow blood pressure Likely due to respiratory distress        Management plans discussed with the patient and he is in agreement.  CODE STATUS: Full  TOTAL TIME TAKING CARE OF THIS PATIENT: 28 minutes.     POSSIBLE D/C 2-3 days, DEPENDING ON CLINICAL CONDITION.   Myranda Pavone M.D on 06/22/2017 at 9:41 AM  Between 7am to 6pm - Pager - [phone] After 6pm go to [email or web address] - password [email or web address]  [facility] [place] [facility]  Office  [phone]  CC: Primary care physician; Patient, No Pcp Per  Note: This dictation was prepared with Dragon dictation along with smaller phrase technology. Any transcriptional errors that result from this process are unintentional.

## 2017-06-22 NOTE — Care Management Note (Signed)
Case Management Note  Patient Details  Name: Donalee CitrinZachary Carmen MRN: [id number] Date of Birth: 1985/06/23  Subjective/Objective: Admitted to [facility] with the diagnosis of acute respiratory failure. Lives alone Mother is Gunnar Fusiaula [phone] Girlfriend is Mis Steelman [phone]). [facility] [address]. [facility]. No primary care physician. Takes care of all basic activities of living himself, drives. Works at [employer].  No home oxygen. Currently on HFNC,                   Action/Plan: Information about physicians accepting new patients given  Expected Discharge Date:  06/21/17               Expected Discharge Plan:     In-House Referral:   yes  Discharge planning Services   yes  Post Acute Care Choice:    Choice offered to:     DME Arranged:    DME Agency:     HH Arranged:    HH Agency:     Status of Service:     If discussed at [employer] Meetings, dates discussed:    Additional Comments:  Gwenette GreetBrenda S Kennedey Digilio, RN MSN CCM Care Management [phone] 06/22/2017, 1:00 PM

## 2017-06-23 LAB — GLUCOSE, CAPILLARY: GLUCOSE-CAPILLARY: 93 mg/dL (ref 65–99)

## 2017-06-23 LAB — LEGIONELLA PNEUMOPHILA SEROGP 1 UR AG: L. pneumophila Serogp 1 Ur Ag: NEGATIVE

## 2017-06-23 MED ORDER — DM-GUAIFENESIN ER 30-600 MG PO TB12
1.0000 | ORAL_TABLET | Freq: Two times a day (BID) | ORAL | Status: DC
  Filled 2017-06-23 (×2): qty 1

## 2017-06-23 MED ORDER — DEXTROMETHORPHAN POLISTIREX ER 30 MG/5ML PO SUER
30.0000 mg | Freq: Two times a day (BID) | ORAL | Status: DC
  Administered 2017-06-23 – 2017-06-28 (×12): 30 mg via ORAL
  Filled 2017-06-23 (×15): qty 5

## 2017-06-23 MED ORDER — GUAIFENESIN ER 600 MG PO TB12
600.0000 mg | ORAL_TABLET | Freq: Two times a day (BID) | ORAL | Status: DC
Start: 2017-06-23 — End: 2017-06-29
  Administered 2017-06-23 – 2017-06-28 (×12): 600 mg via ORAL
  Filled 2017-06-23 (×12): qty 1

## 2017-06-23 NOTE — Progress Notes (Signed)
Decreased FIO2 to 68%

## 2017-06-23 NOTE — Progress Notes (Signed)
Date: 06/23/2017,   MRN# [id number] Edgar Michael 08-Jul-1985 Code Status:     Code Status Orders  (From admission, onward)        Start     Ordered   06/20/17 1155  Full code  Continuous     06/20/17 1155    Code Status History    Date Active Date Inactive Code Status Order ID Comments User Context   This patient has a current code status but no historical code status.      HPI: On high flow, fio2 coming down. He voices he is less shortness of breath. Less coughing.  PMHX:   History reviewed. No pertinent past medical history. Surgical Hx:  History reviewed. No pertinent surgical history. Family Hx:  No family history on file. Social Hx:   Social History   Tobacco Use  . Smoking status: Never Smoker  . Smokeless tobacco: Never Used  Substance Use Topics  . Alcohol use: No    Frequency: Never  . Drug use: No   Medication:    Home Medication:    Current Medication: @CURMEDTAB @   Allergies:  Patient has no known allergies.  Review of Systems: Gen:  Denies  fever, sweats, chills HEENT: Denies blurred vision, double vision, ear pain, eye pain, hearing loss, nose bleeds, sore throat Cvc:  No dizziness, chest pain or heaviness Resp:   Sob slowly improving Gi: Denies swallowing difficulty, stomach pain, nausea or vomiting, diarrhea, constipation, bowel incontinence Gu:  Denies bladder incontinence, burning urine Ext:   No Joint pain, stiffness or swelling Skin: No skin rash, easy bruising or bleeding or hives Endoc:  No polyuria, polydipsia , polyphagia or weight change Psych: No depression, insomnia or hallucinations  Other:  All other systems negative  Physical Examination:   VS: BP 122/74   Pulse 97   Temp 97.7 F (36.5 C) (Oral)   Resp 18   Ht 6\' 1"  (1.854 m)   Wt 95.2 kg (209 lb 14.1 oz)   SpO2 96%   BMI 27.69 kg/m   General Appearance: No distress  Neuro: without focal findings, mental status, speech normal, alert and oriented, cranial nerves 2-12  intact, reflexes normal and symmetric, sensation grossly normal  HEENT: PERRLA, EOM intact, no ptosis, no other lesions noticed, Mallampati: Pulmonary:.No wheezing, No rales  Sputum Production:   Cardiovascular:  Normal S1,S2.  No m/r/g.  Abdominal aorta pulsation normal.    Abdomen:Benign, Soft, non-tender, No masses, hepatosplenomegaly, No lymphadenopathy Endoc: No evident thyromegaly, no signs of acromegaly or Cushing features Skin:   warm, no rashes, no ecchymosis  Extremities: normal, no cyanosis, clubbing, no edema, warm with normal capillary refill. Other findings:   Labs results:   Recent Labs    06/21/17 0459  HGB 12.3*  HCT 35.8*  MCV 86.8  WBC 9.4  BUN 12  CREATININE 1.08  GLUCOSE 121*  CALCIUM 8.7*   HIV Screen 4th Generation wRfx Non Reactive Non Reactive   Comment: (NOTE)    MRSA by PCR NEGATIVE NEGATIVE    Influenza A By PCR NEGATIVE NEGATIVE   Influenza B By PCR NEGATIVE NEGATIVE      Assessment and Plan: Here with bilateral interstitial pneumonia and RML consolidation. Treating community pneumonia and atypical pn for now. No strong hx of aspiration. hiv - ve, legionella urine ag - ve.   Continue zosyn, zithro Wean oxygen as tolerated (on high flow)  Following closely  I have personally obtained a history, examined the patient, evaluated laboratory and  imaging results, formulated the assessment and plan and placed orders.  The Patient requires high complexity decision making for assessment and support, frequent evaluation and titration of therapies, application of advanced monitoring technologies and extensive interpretation of multiple databases.   Herbon Fleming,M.D. Pulmonary & Critical care Medicine [facility]

## 2017-06-23 NOTE — Progress Notes (Addendum)
[facility] - [place] at [facility]   PATIENT NAME: Edgar Michael    MR#:  [id number]  DATE OF BIRTH:  10-27-1985  SUBJECTIVE:   Patient has better shortness of breath and diaphoresis, on HF O2 [place]. REVIEW OF SYSTEMS:    Review of Systems  Constitutional: Negative for fever, chills weight loss HENT: Negative for ear pain, nosebleeds, congestion, facial swelling, rhinorrhea, neck pain, neck stiffness and ear discharge.   Respiratory: Positive for cough and shortness of breath  Cardiovascular: Negative for chest pain, palpitations and leg swelling.  Gastrointestinal: Negative for heartburn, abdominal pain, vomiting, diarrhea or consitpation Genitourinary: Negative for dysuria, urgency, frequency, hematuria Musculoskeletal: Negative for back pain or joint pain Neurological: Negative for dizziness, seizures, syncope, focal weakness,  numbness and headaches.  Hematological: Does not bruise/bleed easily.  Psychiatric/Behavioral: Negative for hallucinations, confusion, dysphoric mood DRUG ALLERGIES:  No Known Allergies  VITALS:  Blood pressure 128/70, pulse 88, temperature 98.9 F (37.2 C), temperature source Oral, resp. rate (!) 21, height 6\' 1"  (1.854 m), weight 209 lb 14.1 oz (95.2 kg), SpO2 95 %.  PHYSICAL EXAMINATION:  Constitutional: Appears well-developed and well-nourished. No distress. HENT: Normocephalic. Marland Kitchen. Oropharynx is clear and moist.  Eyes: Conjunctivae and EOM are normal. PERRLA, no scleral icterus.  Neck: Normal ROM. Neck supple. No JVD. No tracheal deviation. CVS: RRR, S1/S2 +, no murmurs, no gallops, no carotid bruit.  Pulmonary: Normal respiratory effort with bilateral crackles Abdominal: Soft. BS +,  no distension, tenderness, rebound or guarding.  Musculoskeletal: Normal range of motion. No edema and no tenderness.  Neuro: Alert. CN 2-12 grossly intact. No focal deficits. Skin: Skin is warm and dry. No rash noted. Psychiatric: Normal mood and  affect.  LABORATORY PANEL:   CBC Recent Labs  Lab 06/21/17 0459  WBC 9.4  HGB 12.3*  HCT 35.8*  PLT 352   ------------------------------------------------------------------------------------------------------------------  Chemistries  Recent Labs  Lab 06/21/17 0459  NA 141  K 4.3  CL 106  CO2 21*  GLUCOSE 121*  BUN 12  CREATININE 1.08  CALCIUM 8.7*   ------------------------------------------------------------------------------------------------------------------  Cardiac Enzymes Recent Labs  Lab 06/20/17 1036  TROPONINI <0.03   ------------------------------------------------------------------------------------------------------------------  RADIOLOGY:  No results found.   ASSESSMENT AND PLAN:   32 year old male with no past medical history presents with cough and shortness of breath.  1 Sepsis: Patient presents with tachycardia and tachypnea. Sepsis is due to community-acquired pneumonia  2. Acute hypoxic respiratory failure in the setting of community-acquired pneumonia, multilobar PNA. Continue Zosyn and Zithromax. Blood cultures are negative. Sputum culture shows few gram-positive cocci and peers and few gram-positive rods Patient seems to respond to nonrebreather. Wean oxygen as tolerated ( on high flow)  Legionella urine (negatuve) per Dr. Meredeth IdeFleming.  3. Community-acquired pneumonia: Continue plan as stated above  4. Acute kidney injury in the setting of sepsis Creatinine has improved with IV fluids  5. Elevated blood pressure without diagnosis of hypertension Follow blood pressure Likely due to respiratory distress.  Improved.  Management plans discussed with the patient and he is in agreement.  CODE STATUS: Full  TOTAL TIME TAKING CARE OF THIS PATIENT: 28 minutes.   POSSIBLE D/C 2-3 days, DEPENDING ON CLINICAL CONDITION.   Shaune PollackQing Antoinette Haskett M.D on 06/23/2017 at 12:23 PM  Between 7am to 6pm - Pager - [phone] After 6pm go to  [email or web address] - password [email or web address]  [facility] [place] [facility]  Office  [phone]  CC: Primary care physician; Patient, No Pcp Per  Note: This  dictation was prepared with Dragon dictation along with smaller phrase technology. Any transcriptional errors that result from this process are unintentional.

## 2017-06-23 NOTE — Plan of Care (Signed)
  Education: Knowledge of General Education information will improve 06/23/2017 0200 - Progressing by Dwan BoltPage, Zamyia Gowell L, RN   Clinical Measurements: Ability to maintain clinical measurements within normal limits will improve 06/23/2017 0200 - Progressing by Dwan BoltPage, Estefana Taylor L, RN   Clinical Measurements: Will remain free from infection 06/23/2017 0200 - Progressing by Dwan BoltPage, Darious Rehman L, RN   Clinical Measurements: Respiratory complications will improve 06/23/2017 0200 - Progressing by Dwan BoltPage, Izeah Vossler L, RN    Safety: Ability to remain free from injury will improve 06/23/2017 0200 - Progressing by Dwan BoltPage, Pamlea Finder L, RN   Respiratory: Ability to maintain adequate ventilation will improve 06/23/2017 0200 - Progressing by Dwan BoltPage, Takila Kronberg L, RN  Continues on HFNC, no distress noted.

## 2017-06-24 ENCOUNTER — Inpatient Hospital Stay (HOSPITAL_COMMUNITY): Payer: BLUE CROSS/BLUE SHIELD

## 2017-06-24 ENCOUNTER — Inpatient Hospital Stay (HOSPITAL_COMMUNITY): Payer: Self-pay

## 2017-06-24 ENCOUNTER — Inpatient Hospital Stay: Payer: BLUE CROSS/BLUE SHIELD

## 2017-06-24 LAB — GLUCOSE, CAPILLARY: GLUCOSE-CAPILLARY: 100 mg/dL — AB (ref 65–99)

## 2017-06-24 LAB — CREATININE, SERUM
CREATININE: 1.16 mg/dL (ref 0.61–1.24)
GFR calc non Af Amer: >60 mL/min (ref >60)

## 2017-06-24 LAB — CULTURE, RESPIRATORY W GRAM STAIN
Culture: NORMAL
Special Requests: NORMAL

## 2017-06-24 LAB — CULTURE, RESPIRATORY

## 2017-06-24 NOTE — Progress Notes (Signed)
ANTIBIOTIC CONSULT NOTE - INITIAL  Pharmacy Consult for Zosyn Indication: CAP/PNA  No Known Allergies  Patient Measurements: Height: 6\' 1"  (185.4 cm) Weight: 205 lb (93 kg) IBW/kg (Calculated) : 79.9  Vital Signs: Temp: 99.2 F (37.3 C) (02/22 0355) Temp Source: Oral (02/22 0355) BP: 109/61 (02/22 0355) Pulse Rate: 92 (02/22 0355) Intake/Output from previous day: 02/21 0701 - 02/22 0700 In: -  Out: 3175 [Urine:3175] Intake/Output from this shift: No intake/output data recorded.  Labs: Recent Labs    06/24/17 0713  CREATININE 1.16   Estimated Creatinine Clearance: 106.2 mL/min (by C-G formula based on SCr of 1.16 mg/dL). No results for input(s): VANCOTROUGH, VANCOPEAK, VANCORANDOM, GENTTROUGH, GENTPEAK, GENTRANDOM, TOBRATROUGH, TOBRAPEAK, TOBRARND, AMIKACINPEAK, AMIKACINTROU, AMIKACIN in the last 72 hours.   Microbiology: Recent Results (from the past 720 hour(s))  Blood culture (routine x 2)     Status: None (Preliminary result)   Collection Time: 06/20/17 10:36 AM  Result Value Ref Range Status   Specimen Description [facility]LOOD RIGHT [facility]  Final   Special Requests   Final    BOTTLES DRAWN AEROBIC AND ANAEROBIC Blood Culture adequate volume   Culture   Final    NO GROWTH 4 DAYS Performed at [facility], [address] [place], [place] [address]    Report Status PENDING  Incomplete  Blood culture (routine x 2)     Status: None (Preliminary result)   Collection Time: 06/20/17 10:45 AM  Result Value Ref Range Status   Specimen Description BLOOD LEFT AC  Final   Special Requests   Final    BOTTLES DRAWN AEROBIC AND ANAEROBIC Blood Culture results may not be optimal due to an excessive volume of blood received in culture bottles   Culture   Final    NO GROWTH 4 DAYS Performed at [facility], [address] [place], [place] [address]    Report Status PENDING  Incomplete  MRSA PCR Screening     Status: None   Collection Time: 06/20/17  2:29 PM   Result Value Ref Range Status   MRSA by PCR NEGATIVE NEGATIVE Final    Comment:        The GeneXpert MRSA Assay (FDA approved for NASAL specimens only), is one component of a comprehensive MRSA colonization surveillance program. It is not intended to diagnose MRSA infection nor to guide or monitor treatment for MRSA infections. Performed at [facility], [address] [place], [place] [address]   Culture, expectorated sputum-assessment     Status: None   Collection Time: 06/20/17  3:45 PM  Result Value Ref Range Status   Specimen Description Expect. Sput  Final   Special Requests Normal  Final   Sputum evaluation   Final    THIS SPECIMEN IS ACCEPTABLE FOR SPUTUM CULTURE Performed at [facility], [address] [place], [place] [address]    Report Status 06/20/2017 FINAL  Final  Culture, respiratory (NON-Expectorated)     Status: None (Preliminary result)   Collection Time: 06/20/17  3:45 PM  Result Value Ref Range Status   Specimen Description   Final    Expect. Sput Performed at [facility], [address] [place], [place] [address]    Special Requests   Final    Normal Reflexed from [id number] Performed at [facility], [address] [place], [place] [address]    Gram Stain   Final    RARE WBC PRESENT, PREDOMINANTLY PMN FEW GRAM POSITIVE COCCI IN PAIRS FEW  GRAM POSITIVE RODS RARE YEAST    Culture   Final    CULTURE REINCUBATED FOR BETTER GROWTH Performed at [facility], [address], [place], [place] [address]    Report Status PENDING  Incomplete    Medical History: History reviewed. No pertinent past medical history.  Medications:  Infusions:  . sodium chloride 100 mL/hr at 06/24/17 0442  . azithromycin Stopped (06/23/17 1445)  . piperacillin-tazobactam (ZOSYN)  IV Stopped (06/24/17 0535)   Assessment: 31 yom cc dyspnea/persistent cough. Treated last week for influenza with Tamiflu and supportive.  Returned to urgent care d/t persistent SOB and referred to ED. In ED CXR with RML consolidation/patchy lower lobe airspace opacities b/l consistent with multilobar PNA. MRSA PCR negative. HIV negative. Legionella urinary antigen negative.   Plan:  Patient is currently on azithromycin day 3 and Zosyn day 5. Will continue Zosyn 3.375 g EI q 8 hours and f/u planned duration of therapy.   Luisa Hart D, Pharm.D., BCPS Clinical Pharmacist 06/24/2017,10:18 AM

## 2017-06-24 NOTE — Progress Notes (Addendum)
[facility] [place] at [facility]   PATIENT NAME: Shahin Knierim    MR#:  [id number]  DATE OF BIRTH:  May 08, 1985  SUBJECTIVE:   Patient has better shortness of breath and diaphoresis, on HF O2 [place]. REVIEW OF SYSTEMS:    Review of Systems  Constitutional: Negative for fever, chills weight loss HENT: Negative for ear pain, nosebleeds, congestion, facial swelling, rhinorrhea, neck pain, neck stiffness and ear discharge.   Respiratory: Positive for cough and shortness of breath  Cardiovascular: Negative for chest pain, palpitations and leg swelling.  Gastrointestinal: Negative for heartburn, abdominal pain, vomiting, diarrhea or consitpation Genitourinary: Negative for dysuria, urgency, frequency, hematuria Musculoskeletal: Negative for back pain or joint pain Neurological: Negative for dizziness, seizures, syncope, focal weakness,  numbness and headaches.  Hematological: Does not bruise/bleed easily.  Psychiatric/Behavioral: Negative for hallucinations, confusion, dysphoric mood DRUG ALLERGIES:  No Known Allergies  VITALS:  Blood pressure 109/61, pulse 92, temperature 99.2 F (37.3 C), temperature source Oral, resp. rate 18, height 6\' 1"  (1.854 m), weight 205 lb (93 kg), SpO2 94 %.  PHYSICAL EXAMINATION:  Constitutional: Appears well-developed and well-nourished. No distress. HENT: Normocephalic. Marland Kitchen Oropharynx is clear and moist.  Eyes: Conjunctivae and EOM are normal. PERRLA, no scleral icterus.  Neck: Normal ROM. Neck supple. No JVD. No tracheal deviation. CVS: RRR, S1/S2 +, no murmurs, no gallops, no carotid bruit.  Pulmonary: Normal respiratory effort with bilateral crackles Abdominal: Soft. BS +,  no distension, tenderness, rebound or guarding.  Musculoskeletal: Normal range of motion. No edema and no tenderness.  Neuro: Alert. CN 2-12 grossly intact. No focal deficits. Skin: Skin is warm and dry. No rash noted. Psychiatric: Normal mood and affect.   LABORATORY PANEL:   CBC Recent Labs  Lab 06/21/17 0459  WBC 9.4  HGB 12.3*  HCT 35.8*  PLT 352   ------------------------------------------------------------------------------------------------------------------  Chemistries  Recent Labs  Lab 06/21/17 0459 06/24/17 0713  NA 141  --   K 4.3  --   CL 106  --   CO2 21*  --   GLUCOSE 121*  --   BUN 12  --   CREATININE 1.08 1.16  CALCIUM 8.7*  --    ------------------------------------------------------------------------------------------------------------------  Cardiac Enzymes Recent Labs  Lab 06/20/17 1036  TROPONINI <0.03   ------------------------------------------------------------------------------------------------------------------  RADIOLOGY:  Dg Chest Port 1 View  Result Date: 06/24/2017 CLINICAL DATA:  Difficulty breathing. EXAM: PORTABLE CHEST 1 VIEW COMPARISON:  06/20/2017. FINDINGS: Mediastinum hilar structures normal. Heart size stable. Persistent but improving bibasilar pulmonary infiltrates. No pleural effusion or pneumothorax. No acute bony abnormality. IMPRESSION: Persistent but improving bibasilar infiltrates. Electronically Signed   By: Maisie Fus  Register   On: 06/24/2017 12:28     ASSESSMENT AND PLAN:   32 year old male with no past medical history presents with cough and shortness of breath.  1 Sepsis: Patient presents with tachycardia and tachypnea. Sepsis is due to community-acquired pneumonia  2. Acute hypoxic respiratory failure in the setting of community-acquired pneumonia, multilobar PNA. Continue Zosyn and Zithromax. Blood cultures are negative. Sputum culture shows: Normal flora. Patient seems to respond to nonrebreather. Wean oxygen as tolerated ( on high flow)  Legionella urine (negatuve), chest x-ray show improving infiltrate.  3. Community-acquired pneumonia: Continue plan as stated above  4. Acute kidney injury in the setting of sepsis Creatinine has improved with IV  fluids  5. Elevated blood pressure without diagnosis of hypertension Follow blood pressure Likely due to respiratory distress.  Improved.  Management plans discussed with  the patient and he is in agreement.  CODE STATUS: Full  TOTAL TIME TAKING CARE OF THIS PATIENT: 28 minutes.   POSSIBLE D/C 2-3 days, DEPENDING ON CLINICAL CONDITION.   Shaune PollackQing Kejon Feild M.D on 06/24/2017 at 12:54 PM  Between 7am to 6pm - Pager - [phone] After 6pm go to [email or web address] - password [email or web address]  [facility] [place] [facility]  Office  [phone]  CC: Primary care physician; Patient, No Pcp Per  Note: This dictation was prepared with Dragon dictation along with smaller phrase technology. Any transcriptional errors that result from this process are unintentional.

## 2017-06-25 LAB — CULTURE, BLOOD (ROUTINE X 2)
CULTURE: NO GROWTH
CULTURE: NO GROWTH
SPECIAL REQUESTS: ADEQUATE

## 2017-06-25 LAB — GLUCOSE, CAPILLARY: GLUCOSE-CAPILLARY: 105 mg/dL — AB (ref 65–99)

## 2017-06-25 MED ORDER — AZITHROMYCIN 500 MG PO TABS
500.0000 mg | ORAL_TABLET | Freq: Every day | ORAL | Status: AC
  Administered 2017-06-25 – 2017-06-26 (×2): 500 mg via ORAL
  Filled 2017-06-25 (×3): qty 1

## 2017-06-25 MED ORDER — PREDNISONE 50 MG PO TABS
50.0000 mg | ORAL_TABLET | Freq: Every day | ORAL | Status: DC
  Administered 2017-06-26 – 2017-06-28 (×3): 50 mg via ORAL
  Filled 2017-06-25 (×3): qty 1

## 2017-06-25 NOTE — Progress Notes (Signed)
Decreased FIo2 to 45% on HFNC

## 2017-06-25 NOTE — Progress Notes (Addendum)
[facility] - [place] at [facility]   PATIENT NAME: Edgar Michael    MR#:  [id number]  DATE OF BIRTH:  [birth year]  SUBJECTIVE:   Patient feels better, but still has cough and shortness of breath and sweating, on HF O2 [place]. REVIEW OF SYSTEMS:    Review of Systems  Constitutional: Negative for fever, chills weight loss HENT: Negative for ear pain, nosebleeds, congestion, facial swelling, rhinorrhea, neck pain, neck stiffness and ear discharge.   Respiratory: Positive for cough and shortness of breath  Cardiovascular: Negative for chest pain, palpitations and leg swelling.  Gastrointestinal: Negative for heartburn, abdominal pain, vomiting, diarrhea or consitpation Genitourinary: Negative for dysuria, urgency, frequency, hematuria Musculoskeletal: Negative for back pain or joint pain Neurological: Negative for dizziness, seizures, syncope, focal weakness,  numbness and headaches.  Hematological: Does not bruise/bleed easily.  Psychiatric/Behavioral: Negative for hallucinations, confusion, dysphoric mood DRUG ALLERGIES:  No Known Allergies  VITALS:  Blood pressure 125/70, pulse (!) 101, temperature 98.7 F (37.1 C), temperature source Oral, resp. rate 20, height 6\' 1"  (1.854 m), weight 205 lb (93 kg), SpO2 91 %.  PHYSICAL EXAMINATION:  Constitutional: Appears well-developed and well-nourished. No distress. HENT: Normocephalic. Marland Kitchen Oropharynx is clear and moist.  Eyes: Conjunctivae and EOM are normal. PERRLA, no scleral icterus.  Neck: Normal ROM. Neck supple. No JVD. No tracheal deviation. CVS: RRR, S1/S2 +, no murmurs, no gallops, no carotid bruit.  Pulmonary: Normal respiratory effort with bilateral crackles Abdominal: Soft. BS +,  no distension, tenderness, rebound or guarding.  Musculoskeletal: Normal range of motion. No edema and no tenderness.  Neuro: Alert. CN 2-12 grossly intact. No focal deficits. Skin: Skin is warm and dry. No rash noted. Psychiatric:  Normal mood and affect.  LABORATORY PANEL:   CBC Recent Labs  Lab 06/21/17 0459  WBC 9.4  HGB 12.3*  HCT 35.8*  PLT 352   ------------------------------------------------------------------------------------------------------------------  Chemistries  Recent Labs  Lab 06/21/17 0459 06/24/17 0713  NA 141  --   K 4.3  --   CL 106  --   CO2 21*  --   GLUCOSE 121*  --   BUN 12  --   CREATININE 1.08 1.16  CALCIUM 8.7*  --    ------------------------------------------------------------------------------------------------------------------  Cardiac Enzymes Recent Labs  Lab 06/20/17 1036  TROPONINI <0.03   ------------------------------------------------------------------------------------------------------------------  RADIOLOGY:  Dg Chest Port 1 View  Result Date: 06/24/2017 CLINICAL DATA:  Difficulty breathing. EXAM: PORTABLE CHEST 1 VIEW COMPARISON:  06/20/2017. FINDINGS: Mediastinum hilar structures normal. Heart size stable. Persistent but improving bibasilar pulmonary infiltrates. No pleural effusion or pneumothorax. No acute bony abnormality. IMPRESSION: Persistent but improving bibasilar infiltrates. Electronically Signed   By: Maisie Fus  Register   On: 06/24/2017 12:28     ASSESSMENT AND PLAN:   32 year old male with no past medical history presents with cough and shortness of breath.  1 Sepsis: Patient presents with tachycardia and tachypnea. Sepsis is due to community-acquired pneumonia  2. Acute hypoxic respiratory failure in the setting of community-acquired pneumonia, multilobar PNA. Continue Zithromax, disontinue Zosyn. Blood cultures are negative. Sputum culture shows: Normal flora. Legionella urine (negatuve), chest x-ray show improving infiltrate. Wean oxygen to [place] as tolerated.  3. Community-acquired pneumonia: Continue plan as stated above  4. Acute kidney injury in the setting of sepsis Improved with IV fluids  5. Elevated blood pressure  without diagnosis of hypertension Follow blood pressure Likely due to respiratory distress.  Improved.  Management plans discussed with the patient and he is  in agreement.  CODE STATUS: Full  TOTAL TIME TAKING CARE OF THIS PATIENT: 28 minutes.   POSSIBLE D/C 2-3 days, DEPENDING ON CLINICAL CONDITION.   Shaune PollackQing Jakhai Fant M.D on 06/25/2017 at 2:56 PM  Between 7am to 6pm - Pager - [phone] After 6pm go to [email or web address] - password [email or web address]  [facility] [place] [facility]  Office  [phone]  CC: Primary care physician; Patient, No Pcp Per  Note: This dictation was prepared with Dragon dictation along with smaller phrase technology. Any transcriptional errors that result from this process are unintentional.

## 2017-06-25 NOTE — Progress Notes (Signed)
Changed to 6l [place] from HFNC.

## 2017-06-25 NOTE — Progress Notes (Signed)
Pt found sleeping in bed sitting upright snoring

## 2017-06-25 NOTE — Progress Notes (Signed)
PHARMACIST - PHYSICIAN COMMUNICATION DR:   Imogene BurnHEN CONCERNING: Antibiotic IV to Oral Route Change Policy  RECOMMENDATION: This patient is receiving azithromycin by the intravenous route.  Based on criteria approved by the Pharmacy and Therapeutics Committee, the antibiotic(s) is/are being converted to the equivalent oral dose form(s).   DESCRIPTION: These criteria include:  Patient being treated for a respiratory tract infection, urinary tract infection, cellulitis or clostridium difficile associated diarrhea if on metronidazole  The patient is not neutropenic and does not exhibit a GI malabsorption state  The patient is eating (either orally or via tube) and/or has been taking other orally administered medications for a least 24 hours  The patient is improving clinically and has a Tmax < 100.5  If you have questions about this conversion, please contact the Pharmacy Department  []   [phone]( [phone] )  [facility] []   [phone] )  [facility] []   [phone]( [phone]  [facility] []   [phone]( [phone] )  [facility] []   [phone]( [phone] )  [facility]

## 2017-06-26 LAB — GLUCOSE, CAPILLARY: GLUCOSE-CAPILLARY: 105 mg/dL — AB (ref 65–99)

## 2017-06-26 NOTE — Progress Notes (Signed)
[facility] - [place] at [facility]   PATIENT NAME: Edgar Michael    MR#:  [id number]  DATE OF BIRTH:  [birth year]  SUBJECTIVE:   Patient has better cough and shortness of breath, on O2 [place] 5L, off HF O2 [place]. REVIEW OF SYSTEMS:    Review of Systems  Constitutional: Negative for fever, chills weight loss HENT: Negative for ear pain, nosebleeds, congestion, facial swelling, rhinorrhea, neck pain, neck stiffness and ear discharge.   Respiratory: Positive for cough and shortness of breath  Cardiovascular: Negative for chest pain, palpitations and leg swelling.  Gastrointestinal: Negative for heartburn, abdominal pain, vomiting, diarrhea or consitpation Genitourinary: Negative for dysuria, urgency, frequency, hematuria Musculoskeletal: Negative for back pain or joint pain Neurological: Negative for dizziness, seizures, syncope, focal weakness,  numbness and headaches.  Hematological: Does not bruise/bleed easily.  Psychiatric/Behavioral: Negative for hallucinations, confusion, dysphoric mood DRUG ALLERGIES:  No Known Allergies  VITALS:  Blood pressure 132/81, pulse (!) 108, temperature 97.9 F (36.6 C), temperature source Oral, resp. rate 20, height 6\' 1"  (1.854 m), weight 202 lb 13.2 oz (92 kg), SpO2 93 %.  PHYSICAL EXAMINATION:  Constitutional: Appears well-developed and well-nourished. No distress. HENT: Normocephalic. Marland Kitchen Oropharynx is clear and moist.  Eyes: Conjunctivae and EOM are normal. PERRLA, no scleral icterus.  Neck: Normal ROM. Neck supple. No JVD. No tracheal deviation. CVS: RRR, S1/S2 +, no murmurs, no gallops, no carotid bruit.  Pulmonary: Normal respiratory effort, no wheezing or crackles, no use of accessory muscle to breath. Abdominal: Soft. BS +,  no distension, tenderness, rebound or guarding.  Musculoskeletal: Normal range of motion. No edema and no tenderness.  Neuro: Alert. CN 2-12 grossly intact. No focal deficits. Skin: Skin is warm and dry. No  rash noted. Psychiatric: Normal mood and affect.  LABORATORY PANEL:   CBC Recent Labs  Lab 06/21/17 0459  WBC 9.4  HGB 12.3*  HCT 35.8*  PLT 352   ------------------------------------------------------------------------------------------------------------------  Chemistries  Recent Labs  Lab 06/21/17 0459 06/24/17 0713  NA 141  --   K 4.3  --   CL 106  --   CO2 21*  --   GLUCOSE 121*  --   BUN 12  --   CREATININE 1.08 1.16  CALCIUM 8.7*  --    ------------------------------------------------------------------------------------------------------------------  Cardiac Enzymes Recent Labs  Lab 06/20/17 1036  TROPONINI <0.03   ------------------------------------------------------------------------------------------------------------------  RADIOLOGY:  No results found.   ASSESSMENT AND PLAN:   32 year old male with no past medical history presents with cough and shortness of breath.  1 Sepsis: Patient presents with tachycardia and tachypnea. Sepsis is due to community-acquired pneumonia  2. Acute hypoxic respiratory failure in the setting of community-acquired pneumonia, multilobar PNA. Continue Zithromax, disontinued Zosyn. Blood cultures are negative. Sputum culture shows: Normal flora. Legionella urine (negative), chest x-ray show improving infiltrate. Wean oxygen to [place] as tolerated.  3. Community-acquired pneumonia: Continue plan as stated above  4. Acute kidney injury in the setting of sepsis Improved with IV fluids  5. Elevated blood pressure without diagnosis of hypertension Follow blood pressure Likely due to respiratory distress.  Improved.  Management plans discussed with the patient, his parents and he is in agreement.  CODE STATUS: Full  TOTAL TIME TAKING CARE OF THIS PATIENT: 28 minutes.   POSSIBLE D/C 2-3 days, DEPENDING ON CLINICAL CONDITION.   Shaune Pollack M.D on 06/26/2017 at 1:58 PM  Between 7am to 6pm - Pager -  [phone] After 6pm go to [email or web address] - password  [email or web address]  [facility] [place] [facility]  Office  [phone]  CC: Primary care physician; Patient, No Pcp Per  Note: This dictation was prepared with Dragon dictation along with smaller phrase technology. Any transcriptional errors that result from this process are unintentional.

## 2017-06-27 LAB — CREATININE, SERUM
CREATININE: 1.04 mg/dL (ref 0.61–1.24)
GFR calc Af Amer: >60 mL/min (ref >60)
GFR calc non Af Amer: >60 mL/min (ref >60)

## 2017-06-27 MED ORDER — IPRATROPIUM-ALBUTEROL 0.5-2.5 (3) MG/3ML IN SOLN: 3.0000 mL | Freq: Four times a day (QID) | RESPIRATORY_TRACT | Status: DC | PRN

## 2017-06-27 MED ORDER — IPRATROPIUM-ALBUTEROL 0.5-2.5 (3) MG/3ML IN SOLN
3.0000 mL | Freq: Three times a day (TID) | RESPIRATORY_TRACT | Status: DC
  Administered 2017-06-27 – 2017-06-29 (×7): 3 mL via RESPIRATORY_TRACT
  Filled 2017-06-27 (×7): qty 3

## 2017-06-27 NOTE — Progress Notes (Signed)
[facility] - [place] at [facility]   PATIENT NAME: Edgar Michael    MR#:  [id number]  DATE OF BIRTH:  12-25-1985  SUBJECTIVE:   Patient has better cough and shortness of breath, on O2 [place] 5L, change to 4 L. REVIEW OF SYSTEMS:    Review of Systems  Constitutional: Negative for fever, chills weight loss HENT: Negative for ear pain, nosebleeds, congestion, facial swelling, rhinorrhea, neck pain, neck stiffness and ear discharge.   Respiratory: Positive for cough and shortness of breath  Cardiovascular: Negative for chest pain, palpitations and leg swelling.  Gastrointestinal: Negative for heartburn, abdominal pain, vomiting, diarrhea or consitpation Genitourinary: Negative for dysuria, urgency, frequency, hematuria Musculoskeletal: Negative for back pain or joint pain Neurological: Negative for dizziness, seizures, syncope, focal weakness,  numbness and headaches.  Hematological: Does not bruise/bleed easily.  Psychiatric/Behavioral: Negative for hallucinations, confusion, dysphoric mood DRUG ALLERGIES:  No Known Allergies  VITALS:  Blood pressure 124/71, pulse 99, temperature 98.5 F (36.9 C), temperature source Oral, resp. rate 20, height 6\' 1"  (1.854 m), weight 196 lb 11.2 oz (89.2 kg), SpO2 98 %.  PHYSICAL EXAMINATION:  Constitutional: Appears well-developed and well-nourished. No distress. HENT: Normocephalic. Marland Kitchen Oropharynx is clear and moist.  Eyes: Conjunctivae and EOM are normal. PERRLA, no scleral icterus.  Neck: Normal ROM. Neck supple. No JVD. No tracheal deviation. CVS: RRR, S1/S2 +, no murmurs, no gallops, no carotid bruit.  Pulmonary: Normal respiratory effort, no wheezing or crackles, no use of accessory muscle to breath. Abdominal: Soft. BS +,  no distension, tenderness, rebound or guarding.  Musculoskeletal: Normal range of motion. No edema and no tenderness.  Neuro: Alert. CN 2-12 grossly intact. No focal deficits. Skin: Skin is warm and dry. No  rash noted. Psychiatric: Normal mood and affect.  LABORATORY PANEL:   CBC Recent Labs  Lab 06/21/17 0459  WBC 9.4  HGB 12.3*  HCT 35.8*  PLT 352   ------------------------------------------------------------------------------------------------------------------  Chemistries  Recent Labs  Lab 06/21/17 0459  06/27/17 0426  NA 141  --   --   K 4.3  --   --   CL 106  --   --   CO2 21*  --   --   GLUCOSE 121*  --   --   BUN 12  --   --   CREATININE 1.08   < > 1.04  CALCIUM 8.7*  --   --    < > = values in this interval not displayed.   ------------------------------------------------------------------------------------------------------------------  Cardiac Enzymes No results for input(s): TROPONINI in the last 168 hours. ------------------------------------------------------------------------------------------------------------------  RADIOLOGY:  No results found.   ASSESSMENT AND PLAN:   32 year old male with no past medical history presents with cough and shortness of breath.  1 Sepsis: Patient presents with tachycardia and tachypnea. Sepsis is due to community-acquired pneumonia  2. Acute hypoxic respiratory failure in the setting of community-acquired pneumonia, multilobar PNA. Continue Zithromax, disontinued Zosyn. Blood cultures are negative. Sputum culture shows: Normal flora. Legionella urine (negative), chest x-ray show improving infiltrate. Wean oxygen to [place] as tolerated.  3. Community-acquired pneumonia: Continue plan as stated above  4. Acute kidney injury in the setting of sepsis Improved with IV fluids  5. Elevated blood pressure without diagnosis of hypertension Follow blood pressure Likely due to respiratory distress.  Improved.  Management plans discussed with the patient, his parents and he is in agreement.  CODE STATUS: Full  TOTAL TIME TAKING CARE OF THIS PATIENT: 26 minutes.  POSSIBLE D/C 2 days, DEPENDING ON CLINICAL  CONDITION.   Shaune PollackQing Reata Petrov M.D on 06/27/2017 at 1:24 PM  Between 7am to 6pm - Pager - [phone] After 6pm go to [email or web address] - password [email or web address]  [facility] [place] [facility]  Office  [phone]  CC: Primary care physician; Patient, No Pcp Per  Note: This dictation was prepared with Dragon dictation along with smaller phrase technology. Any transcriptional errors that result from this process are unintentional.

## 2017-06-28 LAB — GLUCOSE, CAPILLARY: Glucose-Capillary: 85 mg/dL (ref 65–99)

## 2017-06-28 NOTE — Progress Notes (Signed)
[facility] - [place] at [facility]   PATIENT NAME: Edgar Michael    MR#:  [id number]  DATE OF BIRTH:  1985-11-24  SUBJECTIVE:   Patient has better cough and no shortness of breath, on O2 [place] 3 L, change to 2 L. REVIEW OF SYSTEMS:    Review of Systems  Constitutional: Negative for fever, chills weight loss HENT: Negative for ear pain, nosebleeds, congestion, facial swelling, rhinorrhea, neck pain, neck stiffness and ear discharge.   Respiratory: Positive for cough and no shortness of breath  Cardiovascular: Negative for chest pain, palpitations and leg swelling.  Gastrointestinal: Negative for heartburn, abdominal pain, vomiting, diarrhea or consitpation Genitourinary: Negative for dysuria, urgency, frequency, hematuria Musculoskeletal: Negative for back pain or joint pain Neurological: Negative for dizziness, seizures, syncope, focal weakness,  numbness and headaches.  Hematological: Does not bruise/bleed easily.  Psychiatric/Behavioral: Negative for hallucinations, confusion, dysphoric mood DRUG ALLERGIES:  No Known Allergies  VITALS:  Blood pressure 114/72, pulse 90, temperature 98.1 F (36.7 C), temperature source Oral, resp. rate 20, height 6\' 1"  (1.854 m), weight 196 lb 11.2 oz (89.2 kg), SpO2 96 %.  PHYSICAL EXAMINATION:  Constitutional: Appears well-developed and well-nourished. No distress. HENT: Normocephalic. Marland Kitchen. Oropharynx is clear and moist.  Eyes: Conjunctivae and EOM are normal. PERRLA, no scleral icterus.  Neck: Normal ROM. Neck supple. No JVD. No tracheal deviation. CVS: RRR, S1/S2 +, no murmurs, no gallops, no carotid bruit.  Pulmonary: Normal respiratory effort, no wheezing or crackles, no use of accessory muscle to breath. Abdominal: Soft. BS +,  no distension, tenderness, rebound or guarding.  Musculoskeletal: Normal range of motion. No edema and no tenderness.  Neuro: Alert. CN 2-12 grossly intact. No focal deficits. Skin: Skin is warm and  dry. No rash noted. Psychiatric: Normal mood and affect.  LABORATORY PANEL:   CBC No results for input(s): WBC, HGB, HCT, PLT in the last 168 hours. ------------------------------------------------------------------------------------------------------------------  Chemistries  Recent Labs  Lab 06/27/17 0426  CREATININE 1.04   ------------------------------------------------------------------------------------------------------------------  Cardiac Enzymes No results for input(s): TROPONINI in the last 168 hours. ------------------------------------------------------------------------------------------------------------------  RADIOLOGY:  No results found.   ASSESSMENT AND PLAN:   32 year old male with no past medical history presents with cough and shortness of breath.  1 Sepsis: Patient presents with tachycardia and tachypnea. Sepsis is due to community-acquired pneumonia  2. Acute hypoxic respiratory failure in the setting of community-acquired pneumonia, multilobar PNA. completed Zithromax, disontinued Zosyn. Blood cultures are negative. Sputum culture shows: Normal flora. Legionella urine (negative), chest x-ray show improving infiltrate. Wean oxygen to [place] as tolerated.  3. Community-acquired pneumonia: Continue plan as stated above  4. Acute kidney injury in the setting of sepsis Improved with IV fluids  5. Elevated blood pressure without diagnosis of hypertension Follow blood pressure Likely due to respiratory distress.  Improved.  Management plans discussed with the patient, his parents and he is in agreement.  CODE STATUS: Full  TOTAL TIME TAKING CARE OF THIS PATIENT: 25 minutes.   POSSIBLE D/C 1 days, DEPENDING ON CLINICAL CONDITION.   Shaune PollackQing Vinny Taranto M.D on 06/28/2017 at 2:46 PM  Between 7am to 6pm - Pager - [phone] After 6pm go to [email or web address] - password [email or web address]  [facility] [place] [facility]  Office  [phone]  CC: Primary care  physician; Patient, No Pcp Per  Note: This dictation was prepared with Dragon dictation along with smaller phrase technology. Any transcriptional errors that result from this process are unintentional.

## 2017-06-29 LAB — GLUCOSE, CAPILLARY: GLUCOSE-CAPILLARY: 91 mg/dL (ref 65–99)

## 2017-06-29 MED ORDER — GUAIFENESIN-DM 100-10 MG/5ML PO SYRP: 5.0000 mL | ORAL_SOLUTION | ORAL | 0 refills | Status: AC | PRN

## 2017-06-29 NOTE — Discharge Summary (Signed)
[facility] - [place] at [facility]   PATIENT NAME: Edgar Michael    MR#:  [id number]  DATE OF BIRTH:  21-Mar-1986  DATE OF ADMISSION:  06/20/2017   ADMITTING PHYSICIAN: Katha HammingSnehalatha Konidena, MD  DATE OF DISCHARGE: 06/29/2017 12:00 PM  PRIMARY CARE PHYSICIAN: Patient, No Pcp Per   ADMISSION DIAGNOSIS:  Hypoxia [R09.02] Community acquired pneumonia, unspecified laterality [J18.9] DISCHARGE DIAGNOSIS:  Active Problems:   Acute respiratory failure with hypoxia (HCC)  SECONDARY DIAGNOSIS:  History reviewed. No pertinent past medical history. HOSPITAL COURSE:  32 year old male with no past medical history presents with cough and shortness of breath.  1Sepsis: Patient presented with tachycardia and tachypnea. Sepsis is due to community-acquired pneumonia  2. Acute hypoxic respiratory failure in the setting of community-acquired pneumonia, multilobar PNA. completed Zithromax, disontinued Zosyn. Blood cultures are negative. Sputum culture shows: Normal flora. Legionella urine (negative), chest x-ray show improving infiltrate. He has been on high flow oxygen for several days, then wean down to 6, 5, 4,3, 2 L. He is weaned off oxygen without hypoxia today.  3. Community-acquired pneumonia: as stated above  4. Acute kidney injury in the setting of sepsis Improved with IV fluids  5. Elevated blood pressure without diagnosis of hypertension Follow blood pressure Likely due to respiratory distress.  Improved. DISCHARGE CONDITIONS:  Stable, discharged to home today. CONSULTS OBTAINED:  Treatment Team:  Yevonne PaxKhan, Saadat A, MD DRUG ALLERGIES:  No Known Allergies DISCHARGE MEDICATIONS:   Allergies as of 06/29/2017   No Known Allergies     Medication List    TAKE these medications   guaiFENesin-dextromethorphan 100-10 MG/5ML syrup Commonly known as:  ROBITUSSIN DM Take 5 mLs by mouth every 4 (four) hours as needed for cough.        DISCHARGE  INSTRUCTIONS:  See AVS. If you experience worsening of your admission symptoms, develop shortness of breath, life threatening emergency, suicidal or homicidal thoughts you must seek medical attention immediately by calling 911 or calling your MD immediately  if symptoms less severe.  You Must read complete instructions/literature along with all the possible adverse reactions/side effects for all the Medicines you take and that have been prescribed to you. Take any new Medicines after you have completely understood and accpet all the possible adverse reactions/side effects.   Please note  You were cared for by a hospitalist during your hospital stay. If you have any questions about your discharge medications or the care you received while you were in the hospital after you are discharged, you can call the unit and asked to speak with the hospitalist on call if the hospitalist that took care of you is not available. Once you are discharged, your primary care physician will handle any further medical issues. Please note that NO REFILLS for any discharge medications will be authorized once you are discharged, as it is imperative that you return to your primary care physician (or establish a relationship with a primary care physician if you do not have one) for your aftercare needs so that they can reassess your need for medications and monitor your lab values.    On the day of Discharge:  VITAL SIGNS:  Blood pressure 112/68, pulse 95, temperature 97.9 F (36.6 C), temperature source Oral, resp. rate 14, height 6\' 1"  (1.854 m), weight 200 lb 12.8 oz (91.1 kg), SpO2 92 %. PHYSICAL EXAMINATION:  GENERAL:  32 y.o.-year-old patient lying in the bed with no acute distress.  EYES: Pupils equal, round, reactive to  light and accommodation. No scleral icterus. Extraocular muscles intact.  HEENT: Head atraumatic, normocephalic. Oropharynx and nasopharynx clear.  NECK:  Supple, no jugular venous distention. No  thyroid enlargement, no tenderness.  LUNGS: Normal breath sounds bilaterally, no wheezing, rales,rhonchi or crepitation. No use of accessory muscles of respiration.  CARDIOVASCULAR: S1, S2 normal. No murmurs, rubs, or gallops.  ABDOMEN: Soft, non-tender, non-distended. Bowel sounds present. No organomegaly or mass.  EXTREMITIES: No pedal edema, cyanosis, or clubbing.  NEUROLOGIC: Cranial nerves II through XII are intact. Muscle strength 5/5 in all extremities. Sensation intact. Gait not checked.  PSYCHIATRIC: The patient is alert and oriented x 3.  SKIN: No obvious rash, lesion, or ulcer.  DATA REVIEW:   CBC No results for input(s): WBC, HGB, HCT, PLT in the last 168 hours.  Chemistries  Recent Labs  Lab 06/27/17 0426  CREATININE 1.04     Microbiology Results  Results for orders placed or performed during the hospital encounter of 06/20/17  Blood culture (routine x 2)     Status: None   Collection Time: 06/20/17 10:36 AM  Result Value Ref Range Status   Specimen Description BLOOD RIGHT [facility]  Final   Special Requests   Final    BOTTLES DRAWN AEROBIC AND ANAEROBIC Blood Culture adequate volume   Culture   Final    NO GROWTH 5 DAYS Performed at [facility], [address] [place], [place] [address]    Report Status 06/25/2017 FINAL  Final  Blood culture (routine x 2)     Status: None   Collection Time: 06/20/17 10:45 AM  Result Value Ref Range Status   Specimen Description BLOOD LEFT AC  Final   Special Requests   Final    BOTTLES DRAWN AEROBIC AND ANAEROBIC Blood Culture results may not be optimal due to an excessive volume of blood received in culture bottles   Culture   Final    NO GROWTH 5 DAYS Performed at [facility], [address] [place], [place] [address]    Report Status 06/25/2017 FINAL  Final  MRSA PCR Screening     Status: None   Collection Time: 06/20/17  2:29 PM  Result Value Ref Range Status   MRSA by PCR NEGATIVE NEGATIVE Final     Comment:        The GeneXpert MRSA Assay (FDA approved for NASAL specimens only), is one component of a comprehensive MRSA colonization surveillance program. It is not intended to diagnose MRSA infection nor to guide or monitor treatment for MRSA infections. Performed at [facility], [address] [place], [place] [address]   Culture, expectorated sputum-assessment     Status: None   Collection Time: 06/20/17  3:45 PM  Result Value Ref Range Status   Specimen Description Expect. Sput  Final   Special Requests Normal  Final   Sputum evaluation   Final    THIS SPECIMEN IS ACCEPTABLE FOR SPUTUM CULTURE Performed at [facility], [address] [place] [place], [place] [address]    Report Status 06/20/2017 FINAL  Final  Culture, respiratory (NON-Expectorated)     Status: None   Collection Time: 06/20/17  3:45 PM  Result Value Ref Range Status   Specimen Description   Final    Expect. Sput Performed at [facility], [address] [place], [place] [address]    Special Requests   Final    Normal Reflexed from [id number] Performed at [facility], [address] [place],  [place] [address]    Gram Stain   Final    RARE WBC PRESENT, PREDOMINANTLY PMN FEW GRAM POSITIVE COCCI IN PAIRS FEW GRAM POSITIVE RODS RARE YEAST    Culture   Final    Consistent with normal respiratory flora. Performed at [facility], [address], [place], [place] [address]    Report Status 06/24/2017 FINAL  Final    RADIOLOGY:  No results found.   Management plans discussed with the patient, family and they are in agreement.  CODE STATUS: Full Code   TOTAL TIME TAKING CARE OF THIS PATIENT: 31 minutes.    Shaune Pollack M.D on 06/29/2017 at 3:18 PM  Between 7am to 6pm - Pager - [phone]  After 6pm go to [email or web address] - Social research officer, government  [facility] [place] [facility]  Office  [phone]  CC: Primary care physician; Patient, No Pcp  Per   Note: This dictation was prepared with Dragon dictation along with smaller phrase technology. Any transcriptional errors that result from this process are unintentional.

## 2017-06-29 NOTE — Progress Notes (Signed)
Discharge instructions along with home medications and follow up gone over with patient. He verbalized that he understood instructions. 1 prescriptions given to patient. IV removed. Pt being discharged home on room air, no distress noted. Otilio JeffersonMadelyn S Fenton, RN

## 2017-06-29 NOTE — Progress Notes (Signed)
[facility] - [place] at [facility]        [name] was admitted to the Hospital on 06/20/2017 and Discharged  06/29/2017 and should be excused from work/school   starting 06/20/2017 , may return to work/school without any restrictions until 07/03/2017.  Edgar PollackQing Bryauna Michael M.D on 06/29/2017,at 8:43 AM  [facility] - [place] at [facility]    Office  [phone]

## 2018-11-07 ENCOUNTER — Encounter: Payer: Self-pay | Admitting: Physician Assistant

## 2018-11-07 ENCOUNTER — Ambulatory Visit: Payer: Self-pay | Admitting: Physician Assistant

## 2018-11-07 ENCOUNTER — Other Ambulatory Visit: Payer: Self-pay | Admitting: Family Medicine

## 2018-11-07 ENCOUNTER — Other Ambulatory Visit: Payer: Self-pay

## 2018-11-07 DIAGNOSIS — Z113 Encounter for screening for infections with a predominantly sexual mode of transmission: Secondary | ICD-10-CM

## 2018-11-07 LAB — GRAM STAIN

## 2018-11-07 NOTE — Progress Notes (Signed)
    STI clinic/screening visit  Subjective:  Edgar Michael is a 33 y.o. male being seen today for an STI screening visit. The patient reports they do not have symptoms.  Patient has the following medical conditionshas Acute respiratory failure with hypoxia ([place]) on their problem list.  Chief Complaint  Patient presents with  . SEXUALLY TRANSMITTED DISEASE    Patient reports that he would like a screening due to having a new partner.  Denies any s/s today. HPI   See flowsheet for further details and programmatic requirements.    The following portions of the patient's history were reviewed and updated as appropriate: allergies, current medications, past family history, past medical history, past social history, past surgical history and problem list. Problem list updated.  Objective:  There were no vitals filed for this visit.  Physical Exam Constitutional:      General: He is not in acute distress.    Appearance: Normal appearance.  HENT:     Head: Normocephalic and atraumatic.     Comments: No nits, lice, or balding patches.    Mouth/Throat:     Mouth: Mucous membranes are moist.     Pharynx: Oropharynx is clear. No oropharyngeal exudate or posterior oropharyngeal erythema.  Neck:     Musculoskeletal: Neck supple. No muscular tenderness.  Abdominal:     Palpations: Abdomen is soft. There is no mass.     Tenderness: There is no abdominal tenderness. There is no guarding or rebound.  Genitourinary:    Penis: Normal.      Scrotum/Testes: Normal.     Comments: No edema, erythema, lesions or penile discharge. Lymphadenopathy:     Cervical: No cervical adenopathy.     Upper Body:     Right upper body: No supraclavicular, axillary or epitrochlear adenopathy.     Left upper body: No supraclavicular, axillary or epitrochlear adenopathy.     Lower Body: No right inguinal adenopathy. No left inguinal adenopathy.  Skin:    General: Skin is warm and dry.     Findings: No  bruising, erythema, lesion or rash.  Neurological:     Mental Status: He is alert and oriented to person, place, and time.  Psychiatric:        Mood and Affect: Mood normal.        Behavior: Behavior normal.       Assessment and Plan:  Edgar Michael is a 33 y.o. male presenting to the [facility] Department for STI screening  1. Screening examination for venereal disease Patient is without symptoms today. Reviewed with patient Gram stain results and no treatment indicated today. Rec condoms with all sex Await test results.  Counseled that RN would call if he needs to RTC for any treatment. - Gram stain - Gonococcus culture - HIV [place] [facility] - Syphilis Serology, [place] [facility]     No follow-ups on file.  No future appointments.  Jerene Dilling, PA

## 2018-11-12 LAB — GONOCOCCUS CULTURE

## 2019-03-02 IMAGING — CR DG CHEST 2V
2 series · 2 of 2 positions shown · non-contrast
Comparison: None.

CLINICAL DATA: Flu like symptoms for 1 week. Weakness with fever
and bradycardia.

EXAM:
CHEST  2 VIEW

[chest pa]
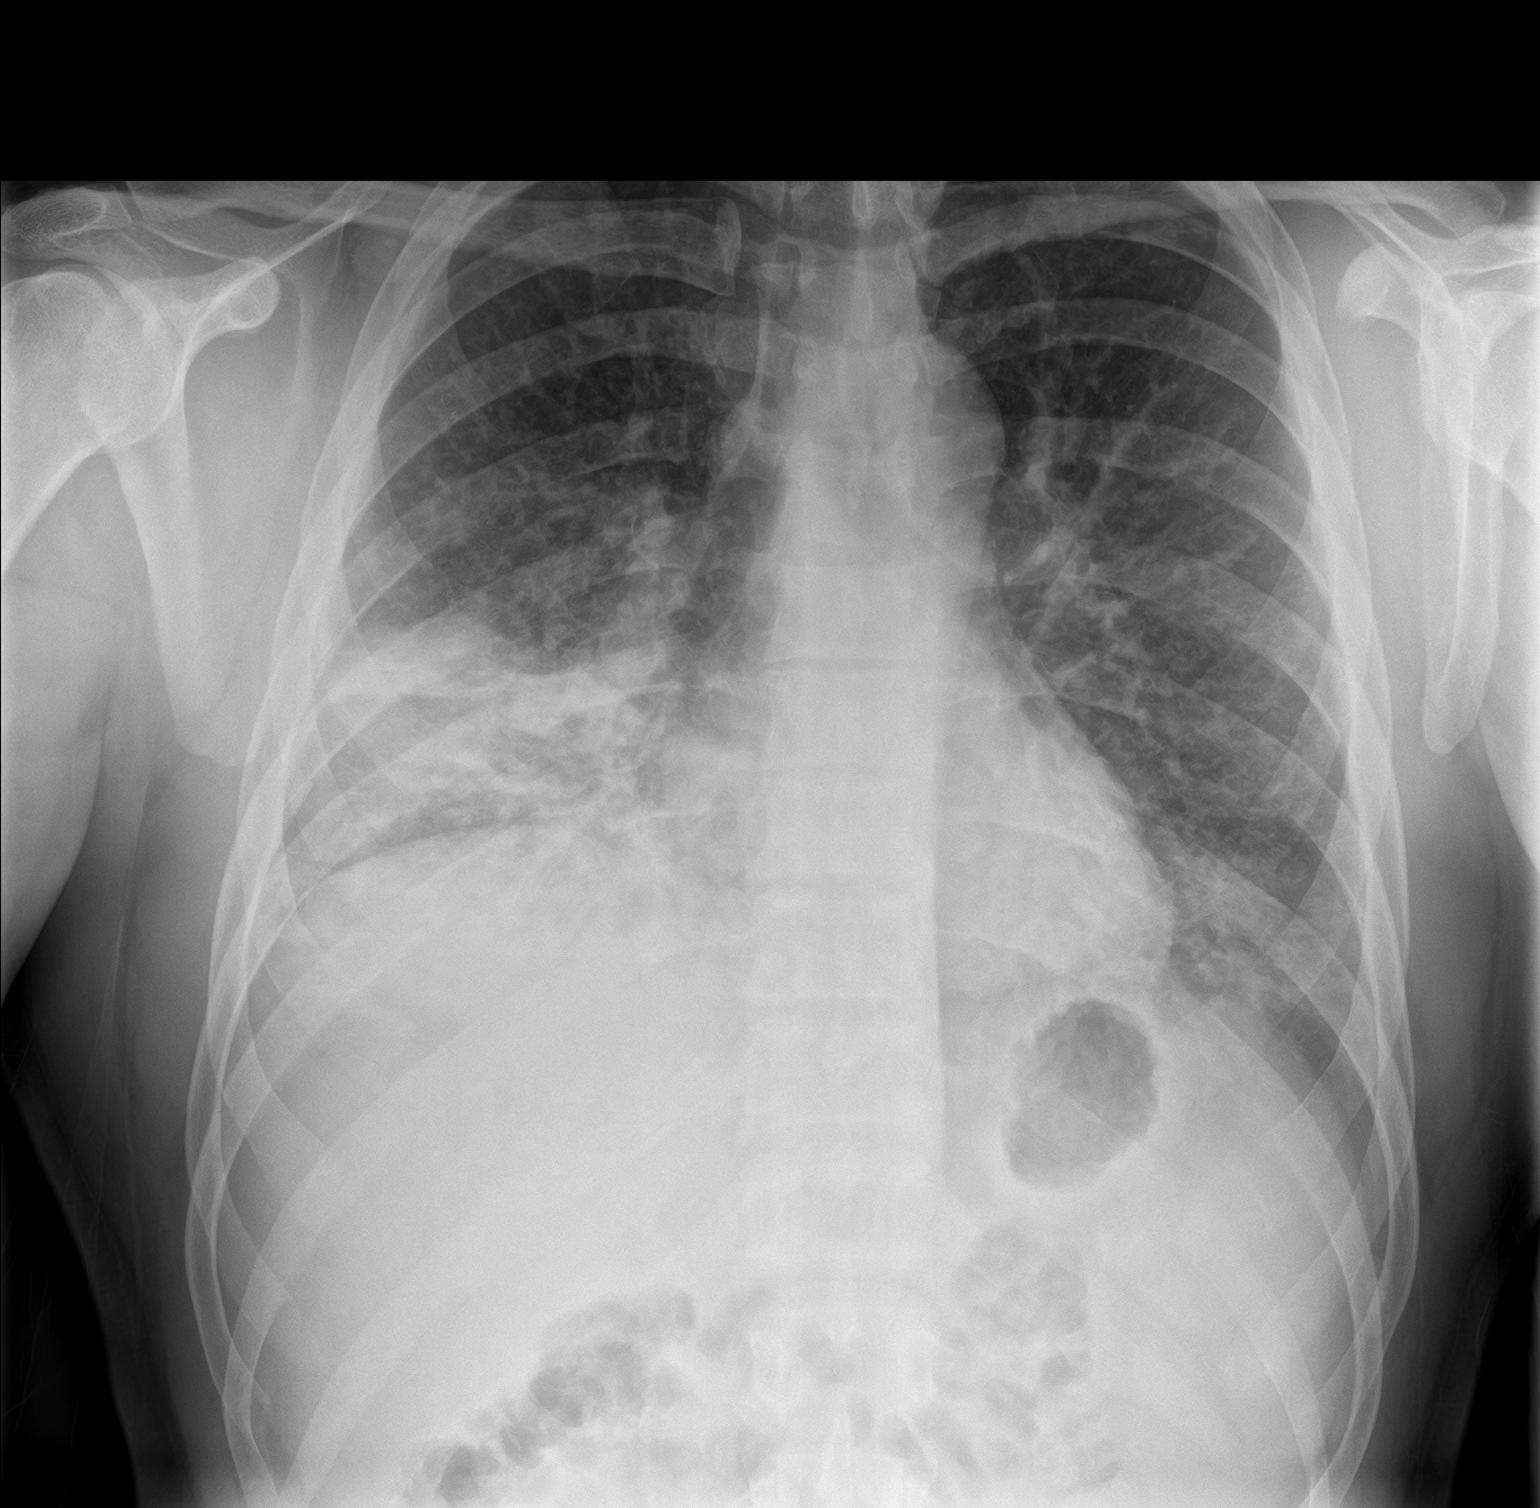

[chest lat]
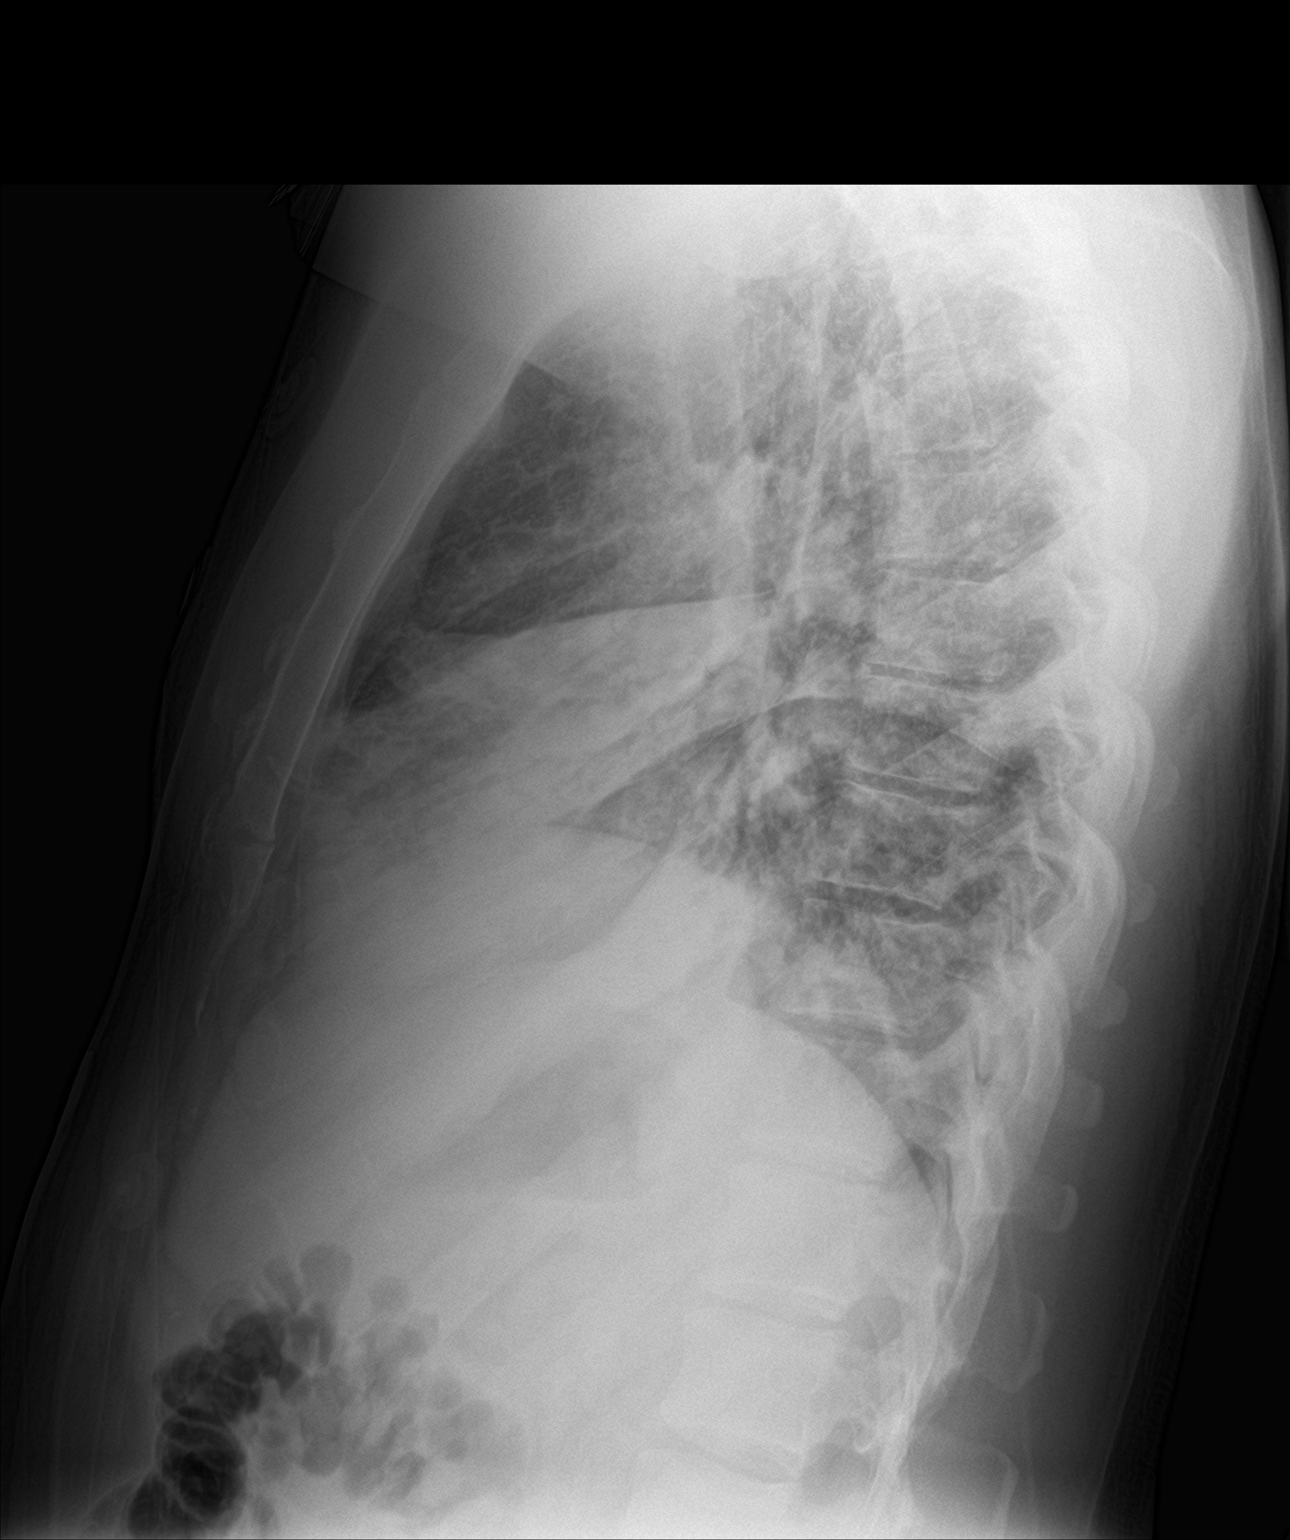

[2 of 2 positions shown; findings below may reference images not displayed]

FINDINGS: The heart size and mediastinal contours are normal. There is dense
right middle lobe airspace disease with mild volume loss, consistent
with pneumonia. Patchy airspace opacities are present in both lower
lobes. There is no significant pleural effusion. The bones appear
unremarkable.
IMPRESSION: Right middle lobe consolidation with patchy lower lobe airspace
opacities bilaterally consistent with multilobar pneumonia.

These results will be called to the ordering clinician or
representative by the Radiologist Assistant, and communication
documented in the PACS or zVision Dashboard.

## 2019-03-27 DIAGNOSIS — L709 Acne, unspecified: Secondary | ICD-10-CM

## 2019-03-27 HISTORY — DX: Acne, unspecified: L70.9

## 2019-08-02 ENCOUNTER — Ambulatory Visit: Payer: Self-pay | Admitting: Dermatology

## 2019-08-15 ENCOUNTER — Ambulatory Visit: Payer: Self-pay | Admitting: Dermatology

## 2019-08-16 ENCOUNTER — Ambulatory Visit (INDEPENDENT_AMBULATORY_CARE_PROVIDER_SITE_OTHER): Payer: BC Managed Care – PPO | Admitting: Dermatology

## 2019-08-16 ENCOUNTER — Other Ambulatory Visit: Payer: Self-pay

## 2019-08-16 DIAGNOSIS — L738 Other specified follicular disorders: Secondary | ICD-10-CM | POA: Diagnosis not present

## 2019-08-16 DIAGNOSIS — K130 Diseases of lips: Secondary | ICD-10-CM

## 2019-08-16 DIAGNOSIS — L700 Acne vulgaris: Secondary | ICD-10-CM | POA: Diagnosis not present

## 2019-08-16 DIAGNOSIS — L853 Xerosis cutis: Secondary | ICD-10-CM | POA: Diagnosis not present

## 2019-08-16 MED ORDER — ISOTRETINOIN 30 MG PO CAPS: 30.0000 mg | ORAL_CAPSULE | Freq: Two times a day (BID) | ORAL | 0 refills | Status: AC

## 2019-08-16 MED ORDER — ISOTRETINOIN 30 MG PO CAPS: 30.0000 mg | ORAL_CAPSULE | Freq: Two times a day (BID) | ORAL | 0 refills | Status: DC

## 2019-08-16 NOTE — Progress Notes (Signed)
   Follow-Up Visit   Subjective  Edgar Michael is a 34 y.o. male who presents for the following: Follow-up (Acne/Sebaceous Hyperplasia follow up - week #12. Isotretinoin 40mg  qd. No mood changes or depression. No muscle or joint aches. Has had some nose bleeds. His skin is dray and his lips chapped. He is using lotion several times daily and lip balm several time daily.).    The following portions of the chart were reviewed this encounter and updated as appropriate: Tobacco  Allergies  Meds  Problems  Med Hx  Surg Hx  Fam Hx      Review of Systems: No other skin or systemic complaints.  Objective  Well appearing patient in no apparent distress; mood and affect are within normal limits.  A focused examination was performed including face. Relevant physical exam findings are noted in the Assessment and Plan.  Objective  Face: Clear today.  Objective  Lips: Xerosis  Objective  Nose: Small yellow papules with a central dell.   Objective  Trunk, extremities: Xerosis  Assessment & Plan  Acne vulgaris Face  Improving but persistent. Will plan labs at next appointment.  Advised patient to d/c isotretinoin 1-2 weeks prior to engagement photos.  Reordered Medications ISOtretinoin (ACCUTANE) 30 MG capsule  Cheilitis Lips  Continue lip balm several time daily.  Sebaceous hyperplasia Nose  Improved but persistent.  ISOtretinoin (ACCUTANE) 30 MG capsule - Nose  Xerosis cutis Trunk, extremities  Continue moisturizer daily.  Return in about 1 month (around 09/15/2019).   I, 09/17/2019, CMA, am acting as scribe for Joanie Coddington, MD .  Documentation: I have reviewed the above documentation for accuracy and completeness, and I agree with the above.  Armida Sans, MD

## 2019-08-20 ENCOUNTER — Encounter: Payer: Self-pay | Admitting: Dermatology

## 2019-09-19 ENCOUNTER — Ambulatory Visit: Payer: BC Managed Care – PPO | Admitting: Dermatology
